# Patient Record
Sex: Female | Born: 1995 | Race: White | Hispanic: No | Smoking: Former smoker
Health system: Southern US, Community
[De-identification: ages and names within clinical notes are randomized; demographics above are authoritative.]

## PROBLEM LIST (undated history)

## (undated) DIAGNOSIS — T7840XA Allergy, unspecified, initial encounter: Secondary | ICD-10-CM

## (undated) DIAGNOSIS — M1990 Unspecified osteoarthritis, unspecified site: Secondary | ICD-10-CM

## (undated) DIAGNOSIS — J45909 Unspecified asthma, uncomplicated: Secondary | ICD-10-CM

## (undated) DIAGNOSIS — M419 Scoliosis, unspecified: Secondary | ICD-10-CM

## (undated) DIAGNOSIS — F509 Eating disorder, unspecified: Secondary | ICD-10-CM

## (undated) DIAGNOSIS — R51 Headache: Secondary | ICD-10-CM

## (undated) DIAGNOSIS — F419 Anxiety disorder, unspecified: Secondary | ICD-10-CM

## (undated) DIAGNOSIS — F329 Major depressive disorder, single episode, unspecified: Secondary | ICD-10-CM

## (undated) DIAGNOSIS — F32A Depression, unspecified: Secondary | ICD-10-CM

## (undated) HISTORY — DX: Major depressive disorder, single episode, unspecified: F32.9

## (undated) HISTORY — DX: Depression, unspecified: F32.A

## (undated) HISTORY — PX: DENTAL SURGERY: SHX609

## (undated) HISTORY — DX: Scoliosis, unspecified: M41.9

---
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
## 2017-08-11 ENCOUNTER — Other Ambulatory Visit: Payer: Self-pay

## 2017-08-11 ENCOUNTER — Encounter: Payer: Self-pay | Admitting: Family Medicine

## 2017-08-11 ENCOUNTER — Ambulatory Visit (INDEPENDENT_AMBULATORY_CARE_PROVIDER_SITE_OTHER): Payer: BLUE CROSS/BLUE SHIELD | Admitting: Family Medicine

## 2017-08-11 VITALS — BP 125/86 | HR 82 | Temp 97.8°F | Resp 18 | Ht 66.77 in | Wt 130.8 lb

## 2017-08-11 DIAGNOSIS — M545 Low back pain, unspecified: Secondary | ICD-10-CM | POA: Insufficient documentation

## 2017-08-11 DIAGNOSIS — Z5181 Encounter for therapeutic drug level monitoring: Secondary | ICD-10-CM

## 2017-08-11 DIAGNOSIS — G8929 Other chronic pain: Secondary | ICD-10-CM | POA: Diagnosis not present

## 2017-08-11 DIAGNOSIS — F640 Transsexualism: Secondary | ICD-10-CM | POA: Diagnosis not present

## 2017-08-11 DIAGNOSIS — Z789 Other specified health status: Secondary | ICD-10-CM

## 2017-08-11 MED ORDER — TESTOSTERONE CYPIONATE 200 MG/ML IM SOLN: INTRAMUSCULAR | 0 refills | Status: DC

## 2017-08-11 NOTE — Patient Instructions (Signed)
     IF you received an x-ray today, you will receive an invoice from [facility]. Please contact [facility] at [phone] with questions or concerns regarding your invoice.   IF you received labwork today, you will receive an invoice from [facility]. Please contact [facility] at [phone] with questions or concerns regarding your invoice.   Our billing staff will not be able to assist you with questions regarding bills from these companies.  You will be contacted with the lab results as soon as they are available. The fastest way to get your results is to activate your My Chart account. Instructions are located on the last page of this paperwork. If you have not heard from us regarding the results in 2 weeks, please contact this office.     

## 2017-08-11 NOTE — Progress Notes (Signed)
Subjective:  By signing my name below, I, Susan Tran, attest that this documentation has been prepared under the direction and in the presence of Susan SorensonEva Shaw, MD. Electronically Signed: Stann Oresung-Kai Tran, Scribe. 08/11/2017 , 5:16 PM .  Patient was seen in Room 3 .   Patient ID: Susan Tran, female    DOB: [birth year], 22 y.o.   MRN: [id number] Chief Complaint  Patient presents with  . Establish Care    Pt states his PCP is retiring and needs a new PCP  . Transitioning    Pt states he is currently taking testerone for a year.    HPI Susan Tran is a 22 y.o. female who [facility]. He is currently taking testosterone for transitioning. Patient was previously followed by Susan Tran, who is retiring on March 31st. He's had his blood hormone levels checked recently at his last appoint and was informed that his levels were looking good. He notes that the current dose is working well for him. He's been on this dose of testosterone for over a year now. He's at "maintenance level" now. He started his testosterone in Nov 21st, 2017. He stopped having periods after 3rd-4th month of injections.   Patient denies history of of chronic medical problems.   He also denies having albuterol inhaler due to cost prohibitive. He mentions asthma was worst in middle school so he stopped playing sports and doesn't have any problem with it since but also does do much physical activity. He reports the most activity he has is lifting at work, causing him back pain. He was prescribed medication for this, but he tries not to take it.   He has a history of depression when he was 5115-22 years old. He denies taking medication for it in the past few years. He took Prozac once while in the hospital in 2014, but never taken chronic medication for anxiety/depression.   He notes drug allergies of penicillin causing him to vomit, and cephalosporins causing him to vomit and/or rash.   He  quit smoking a while back, and hasn't smoked this year.   He denies surgical history.   He came into the office today with his fiance, Susan Tran.   Past Medical History:  Diagnosis Date  . Allergy   . Anxiety   . Asthma   . Depression   . Eating disorder    can go "days" with eating very little.   Marland Kitchen. Headache(784.0)    History reviewed. No pertinent surgical history. Prior to Admission medications   Medication Sig Start Date End Date Taking? Authorizing Provider  ibuprofen (ADVIL,MOTRIN) 200 MG tablet Take 1.5-2 tablets (300-400 mg total) by mouth every 6 (six) hours as needed for pain. Patient may resume home supply. 01/26/13  Yes Winson, Susan BunKim B, NP  testosterone cypionate (DEPOTESTOSTERONE CYPIONATE) 200 MG/ML injection INJ 0.5 ML IM Q 2 WKS UTD 08/06/17  Yes [provider]  albuterol (PROVENTIL HFA;VENTOLIN HFA) 108 (90 BASE) MCG/ACT inhaler Inhale 2 puffs into the lungs every 6 (six) hours as needed for wheezing or shortness of breath. Pateint may resume home supply. 01/26/13   Winson, Susan BunKim B, NP  FLUoxetine (PROZAC) 20 MG capsule Take 1 capsule (20 mg total) by mouth daily. Patient not taking: Reported on 01/08/2016 01/26/13   Susan SchimkeWinson, Susan B, NP   Allergies  Allergen Reactions  . Cephalosporins Other (See Comments)    "very sick,flu like symptoms"  . Penicillins Other (See Comments)    "  very sick, flu like symptoms"   History reviewed. No pertinent family history. Social History   Socioeconomic History  . Marital status: Single    Spouse name: None  . Number of children: None  . Years of education: None  . Highest education level: None  Social Needs  . Financial resource strain: None  . Food insecurity - worry: None  . Food insecurity - inability: None  . Transportation needs - medical: None  . Transportation needs - non-medical: None  Occupational History  . None  Tobacco Use  . Smoking status: Never Smoker  . Smokeless tobacco: Never Used  Substance and Sexual  Activity  . Alcohol use: No  . Drug use: No  . Sexual activity: No  Other Topics Concern  . None  Social History Narrative  . None   Depression screen [facility] 2/9 08/11/2017  Decreased Interest 0  Down, Depressed, Hopeless 0  PHQ - 2 Score 0    Review of Systems  Constitutional: Negative for chills, fatigue, fever and unexpected weight change.  Respiratory: Negative for cough.   Gastrointestinal: Negative for constipation, diarrhea, nausea and vomiting.  Skin: Negative for rash and wound.  Neurological: Negative for dizziness, weakness and headaches.       Objective:   Physical Exam  Constitutional: She is oriented to person, place, and time. She appears well-developed and well-nourished. No distress.  HENT:  Head: Normocephalic and atraumatic.  Eyes: EOM are normal. Pupils are equal, round, and reactive to light.  Neck: Neck supple. No thyromegaly present.  Cardiovascular: Normal rate, regular rhythm and normal heart sounds.  No murmur heard. Pulmonary/Chest: Effort normal and breath sounds normal. No respiratory distress. She has no wheezes.  Musculoskeletal: Normal range of motion.  Neurological: She is alert and oriented to person, place, and time.  Skin: Skin is warm and dry.  Psychiatric: She has a normal mood and affect. Her behavior is normal.  Nursing note and vitals reviewed.   Pulse 82   Temp 97.8 F (36.6 C) (Oral)   Resp 18   Ht 5' 6.77" (1.696 m)   Wt 130 lb 12.8 oz (59.3 kg)   SpO2 98%   BMI 20.63 kg/m      Assessment & Plan:   1. 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 Jordan Tran - saw Dr. Jeral Tran and referred to PT at [facility] - went once IMPRESSION: Abnormal appearance of the L5 pars. On the left, pars appears elongated/thin but grossly intact. On the right, cannot confirm intact pars.  Dysplastic lamina/spinous process. 5 mm anterior slip L5 with mild uncovering of the disc. Mild L5-S1 foraminal narrowing greater on the left with mild contact with the exiting L5 nerve root. No significant spinal stenosis.  If it would change the patient's course of therapy, CT imaging to evaluate the L5 pars region and posterior ring can be performed. In order to minimize radiation, imaging from mid L4 through mid S1 with sagittal and coronal reconstruction may be obtained (would not obtained a full lumbar spine CT).   2. Female-to-female transgender person - refilled testosterone x 3 mos while waiting on Dr. Mariella Saa records - on average dose and stable for > 1 yr  3. Medication monitoring encounter   Future lab orders placed - will have pt come have labs drawn in ~6 mos if  I don't get copies of records/labs from Dr. Mariella Saa office in the interim as reports had labs done there recently and were normal.   Orders Placed This Encounter  Procedures  . Lipid panel    Standing Status:   Future    Standing Expiration Date:   09/25/2018    Order Specific Question:   Has the patient fasted?    Answer:   Yes  . CBC with Differential/Platelet    Standing Status:   Future    Standing Expiration Date:   09/25/2018  . Comprehensive metabolic panel    Standing Status:   Future    Standing Expiration Date:   09/25/2018    Order Specific Question:   Has the patient fasted?    Answer:   Yes  . TSH    Standing Status:   Future    Standing Expiration Date:   09/25/2018  . TestT+TestF+SHBG    Standing Status:   Future    Standing Expiration Date:   09/25/2018  . Estradiol    Standing Status:   Future    Standing Expiration Date:   09/25/2018    Meds ordered this encounter  Medications  . testosterone cypionate (DEPOTESTOSTERONE CYPIONATE) 200 MG/ML injection    Sig: INJ 0.5 ML [place] Q 2 WKS    Dispense:  6 mL    Refill:  0    I personally performed the services described in this documentation,  which was scribed in my presence. The recorded information has been reviewed and considered, and addended by me as needed.   Susan Sorenson, M.D.  Primary Care at [facility] [address] [place], [place] [address] [phone] phone [phone] fax  09/24/17 12:22 PM

## 2017-09-24 ENCOUNTER — Encounter: Payer: Self-pay | Admitting: Family Medicine

## 2017-09-24 ENCOUNTER — Other Ambulatory Visit: Payer: Self-pay | Admitting: Family Medicine

## 2017-09-24 NOTE — Telephone Encounter (Signed)
Yes, will refill x additional 3 mos but I put in future orders and would like pt to come in for lab only visit sometime in the next 3 mos before further refills

## 2017-09-24 NOTE — Telephone Encounter (Signed)
Testosterone Last filled:08/11/17 Last OV:08/11/17 NWG:NFAO Pharmacy: [id number] - [place], [place] - [address] AT [address] [phone] (Phone) [phone] (Fax)

## 2017-09-24 NOTE — Telephone Encounter (Signed)
Please send letter re request for labs.  Please use female pronouns - or [name].  I would like you to come by the office for a "lab-only" visit before you need additional medication refills in another 3 months.  You may walk-in to the [address] appointment clinic any time (M-F 8-6, Sat 8-4) to have your blood drawn without an appointment as I have already ordered the "future" labs in your chart. Make sure to let the front desk know that you are there for a lab-only visit and that you do not need to see a provider.  You do need to be fasting.

## 2017-09-24 NOTE — Telephone Encounter (Signed)
Attempted to call pt. Mail box full.  °

## 2017-09-30 ENCOUNTER — Telehealth: Payer: Self-pay

## 2017-09-30 NOTE — Telephone Encounter (Signed)
PA for testosterone started

## 2017-12-04 ENCOUNTER — Other Ambulatory Visit: Payer: Self-pay | Admitting: Family Medicine

## 2018-01-29 ENCOUNTER — Telehealth: Payer: Self-pay | Admitting: Family Medicine

## 2018-01-29 NOTE — Telephone Encounter (Signed)
Called and spoke with pt. Informed pt of Shaw being on leave and rescheduled appt . Advised of time, building and late policy. °

## 2018-02-05 ENCOUNTER — Telehealth: Payer: Self-pay

## 2018-02-05 NOTE — Telephone Encounter (Signed)
Called pt. To reschedule visit with Dr. Clelia CroftShaw on 03/02/18. If pt. Calls back please reschedule with Dr. Leretha PolSantiago and only Dr. Leretha PolSantiago

## 2018-02-09 ENCOUNTER — Ambulatory Visit: Payer: BLUE CROSS/BLUE SHIELD | Admitting: Family Medicine

## 2018-03-02 ENCOUNTER — Ambulatory Visit: Payer: BLUE CROSS/BLUE SHIELD | Admitting: Family Medicine

## 2018-03-02 ENCOUNTER — Telehealth: Payer: Self-pay | Admitting: Family Medicine

## 2018-03-02 MED ORDER — TESTOSTERONE CYPIONATE 200 MG/ML IM SOLN
INTRAMUSCULAR | 0 refills | Status: DC
Start: 2018-03-02 — End: 2018-03-03

## 2018-03-02 NOTE — Telephone Encounter (Signed)
I recommend that pt come in and have his future labs drawn at a lab only visit asap - can do that at any time -- then rec he resched the OV to be with me for whenever next available.  As soon as I get the lab results, I will be happy to send in enough refills to get him to whenever his appt w/ me is sched for.  And then we will have the results avail to review together at his visit!   It is important he still be on the testosterone when he gets his labs drawn so I went ahead and sent in one a 1 mo supply to [facility] -  hopefully this will give him sufficient time to get in for the lab only visit sometime within the next month.

## 2018-03-02 NOTE — Telephone Encounter (Signed)
I had to reschedule an appt for Susan Tran. He was scheduled with Susan Tran on 03/02/2018 but was cancelled because Susan Tran was out. Rescheduled with Susan Tran for 03/30/2018. The patient is worried about his testosterone. He doesn't want to run out before he is able to be seen.

## 2018-03-03 ENCOUNTER — Telehealth: Payer: Self-pay | Admitting: *Deleted

## 2018-03-03 MED ORDER — TESTOSTERONE CYPIONATE 200 MG/ML IM SOLN: INTRAMUSCULAR | 0 refills | Status: DC

## 2018-03-03 NOTE — Addendum Note (Signed)
Addended by: Sherren Mocha on: 03/03/2018 12:27 PM   Modules accepted: Orders

## 2018-03-03 NOTE — Telephone Encounter (Signed)
Patient returning call. Advised him of Dr Alver Fisher message below. Patient rescheduled appointment to 05/11/18 at 3:00pm

## 2018-03-03 NOTE — Telephone Encounter (Signed)
Dr. Clelia Croft, Patient states there is no way he can get in for lab draws until his appointment on the 03-30-2018.  States it is out of his way and he works.  Advised of hours

## 2018-03-03 NOTE — Telephone Encounter (Signed)
Okay.  I would still rather him reschedule with me and then refill his testosterone to get him to that appointment, even if it is a bit later, as his labs and refill request will still be coming to me even if he sees [place] -do not want to end up in a right hand doing one thing and left another situation. I will cancel the one month refill I sent in yesterday and instead send in a 63-month.  That should give him adequate time to find an appointment time with me that works.

## 2018-03-03 NOTE — Telephone Encounter (Signed)
No voicemail set up  Please advise patient from Dr. Clelia Croft below note.

## 2018-03-09 NOTE — Telephone Encounter (Signed)
Unable to reach.

## 2018-03-30 ENCOUNTER — Ambulatory Visit: Payer: BLUE CROSS/BLUE SHIELD | Admitting: Family Medicine

## 2018-05-11 ENCOUNTER — Ambulatory Visit (INDEPENDENT_AMBULATORY_CARE_PROVIDER_SITE_OTHER): Payer: BLUE CROSS/BLUE SHIELD | Admitting: Family Medicine

## 2018-05-11 ENCOUNTER — Encounter: Payer: Self-pay | Admitting: Family Medicine

## 2018-05-11 ENCOUNTER — Other Ambulatory Visit: Payer: Self-pay

## 2018-05-11 DIAGNOSIS — Z5181 Encounter for therapeutic drug level monitoring: Secondary | ICD-10-CM | POA: Diagnosis not present

## 2018-05-11 DIAGNOSIS — F640 Transsexualism: Secondary | ICD-10-CM

## 2018-05-11 DIAGNOSIS — Z789 Other specified health status: Secondary | ICD-10-CM

## 2018-05-11 MED ORDER — "NEEDLE (DISP) 23G X 3/4"" MISC": 1.0000 [IU] | 0 refills | Status: DC | PRN

## 2018-05-11 MED ORDER — TESTOSTERONE CYPIONATE 200 MG/ML IM SOLN: INTRAMUSCULAR | 1 refills | Status: DC

## 2018-05-11 NOTE — Progress Notes (Addendum)
Subjective:    Patient: Susan Tran  DOB: [birth year]; 22 y.o.   MRN: [id number]  Chief Complaint  Patient presents with  . Medication Management    HPI  Started T 04/16/16.  Injection was last done 12/2  Has been on the 0.5 q2 yrs and feels like he is doing really well on this dose without complaints. Susan Tran is here today with his fiance Susan Tran (cis-female).  Medical History Past Medical History:  Diagnosis Date  . Acquired scoliosis 10/30/2015   11/27/2015 L-spine xray mild spondylolisthesis of L5 on S1. 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 12/11/15 Lumbar MRI ordered by [facility] shows abnml L5 pars with Lt elongated/thin and Rt can't confirm intact w/ dysplastic lamina/spinous process; 5mm ant slip L5 w/ mild uncovering of disc. Mild L5-S1 foraminal narrowing Lt>Rt w/ mild L5 nerve root contact; referred to Dr. Jeral FruitBotero who rec PT  . Allergy   . Anxiety   . Asthma 2012   no problems/flairs since middle school. does NOT need to keep inhaler on hand  . Depression 2014-2015   hospitalized in 2014 after self-harm, SI - started on prozac but then never any outpatient medication treatment following or recurrent exacerbations  . Eating disorder    can go "days" with eating very little.   Marland Kitchen. Headache(784.0)    No past surgical history on file. Current Outpatient Medications on File Prior to Visit  Medication Sig Dispense Refill  . albuterol (PROVENTIL HFA;VENTOLIN HFA) 108 (90 BASE) MCG/ACT inhaler Inhale 2 puffs into the lungs every 6 (six) hours as needed for wheezing or shortness of breath. Pateint may resume home supply.    Marland Kitchen. ibuprofen (ADVIL,MOTRIN) 200 MG tablet Take 1.5-2 tablets (300-400 mg total) by mouth every 6 (six) hours as needed for pain. Patient may resume home supply.    . naproxen (EC NAPROSYN) 500 MG EC tablet Take by mouth.    . testosterone cypionate (DEPOTESTOSTERONE CYPIONATE) 200 MG/ML injection INJECT 0.5ML IN THE MUSCLE EVERY 14 DAYS. 3 mL 0   No current  facility-administered medications on file prior to visit.    Allergies  Allergen Reactions  . Cephalosporins Other (See Comments)    "very sick,flu like symptoms"  . Penicillins Other (See Comments)    "very sick, flu like symptoms"   No family history on file. Social History   Socioeconomic History  . Marital status: Significant Other    Spouse name: Susan Tran  . Number of children: 0  . Years of education: Not on file  . Highest education level: Not on file  Occupational History  . Not on file  Social Needs  . Financial resource strain: Not on file  . Food insecurity:    Worry: Not on file    Inability: Not on file  . Transportation needs:    Medical: Not on file    Non-medical: Not on file  Tobacco Use  . Smoking status: Former Smoker    Last attempt to quit: 2018    Years since quitting: 1.9  . Smokeless tobacco: Never Used  Substance and Sexual Activity  . Alcohol use: No  . Drug use: No  . Sexual activity: Yes    Birth control/protection: Other-see comments    Comment: NA - pt is biologically female and in monogamous relationship with a female  Lifestyle  . Physical activity:    Days per week: Not on file    Minutes per session: Not on file  . Stress: Not on file  Relationships  . Social connections:    Talks on phone: Not on file    Gets together: Not on file    Attends religious service: Not on file    Active member of club or organization: Not on file    Attends meetings of clubs or organizations: Not on file    Relationship status: Not on file  Other Topics Concern  . Not on file  Social History Narrative  . Not on file   Depression screen [facility] 2/9 05/11/2018 08/11/2017  Decreased Interest 0 0  Down, Depressed, Hopeless 0 0  PHQ - 2 Score 0 0    ROS As noted in HPI  Objective:  BP 102/67   Pulse 72   Temp 98 F (36.7 C) (Oral)   Resp 16   Wt 135 lb (61.2 kg)   SpO2 100%   BMI 21.29 kg/m  Physical Exam Constitutional:      General: He  is not in acute distress.    Appearance: He is well-developed. He is not diaphoretic.  HENT:     Head: Normocephalic and atraumatic.     Right Ear: External ear normal.     Left Ear: External ear normal.  Eyes:     General: No scleral icterus.    Conjunctiva/sclera: Conjunctivae normal.  Neck:     Musculoskeletal: Normal range of motion and neck supple.     Thyroid: No thyromegaly.  Cardiovascular:     Rate and Rhythm: Normal rate and regular rhythm.     Heart sounds: Normal heart sounds.  Pulmonary:     Effort: Pulmonary effort is normal. No respiratory distress.     Breath sounds: Normal breath sounds.  Lymphadenopathy:     Cervical: No cervical adenopathy.  Skin:    General: Skin is warm and dry.     Findings: No erythema.  Neurological:     Mental Status: He is alert and oriented to person, place, and time.  Psychiatric:        Behavior: Behavior normal.     POC TESTING Office Visit on 05/11/2018  Component Date Value Ref Range Status  . Cholesterol, Total 05/11/2018 116  100 - 199 mg/dL Final  . Triglycerides 05/11/2018 104  0 - 149 mg/dL Final  . HDL 16/02/9603 35* >39 mg/dL Final  . VLDL Cholesterol Cal 05/11/2018 21  5 - 40 mg/dL Final  . LDL Calculated 05/11/2018 60  0 - 99 mg/dL Final  . Chol/HDL Ratio 05/11/2018 3.3  0.0 - 4.4 ratio Final   Comment:                                   T. Chol/HDL Ratio                                             Men  Women                               1/2 Avg.Risk  3.4    3.3                                   Avg.Risk  5.0    4.4  2X Avg.Risk  9.6    7.1                                3X Avg.Risk 23.4   11.0   . Estradiol 05/11/2018 177.1  pg/mL Final   Comment:                     Adult Female:                       Follicular phase   12.5 -   166.0                       Ovulation phase    85.8 -   498.0                       Luteal phase       43.8 -   211.0                        Postmenopausal     <6.0 -    54.7                     Pregnancy                       1st trimester     215.0 - >4300.0                     Girls (1-10 years)    6.0 -    27.0 Roche ECLIA methodology   . Testosterone, Total, LC/MS 05/11/2018 234.8* 10.0 - 55.0 ng/dL Final  . Testosterone, Free 05/11/2018 4.0  0.0 - 4.2 pg/mL Final  . Sex Hormone Binding 05/11/2018 36.3  24.6 - 122.0 nmol/L Final  . TSH 05/11/2018 1.230  0.450 - 4.500 uIU/mL Final  . Glucose 05/11/2018 61* 65 - 99 mg/dL Final  . BUN 16/02/9603 10  6 - 20 mg/dL Final  . Creatinine, Ser 05/11/2018 0.84  0.57 - 1.00 mg/dL Final  . GFR calc non Af Amer 05/11/2018 100  >59 mL/min/1.73 Final  . GFR calc Af Amer 05/11/2018 116  >59 mL/min/1.73 Final  . BUN/Creatinine Ratio 05/11/2018 12  9 - 23 Final  . Sodium 05/11/2018 140  134 - 144 mmol/L Final  . Potassium 05/11/2018 4.2  3.5 - 5.2 mmol/L Final  . Chloride 05/11/2018 102  96 - 106 mmol/L Final  . CO2 05/11/2018 23  20 - 29 mmol/L Final  . Calcium 05/11/2018 9.5  8.7 - 10.2 mg/dL Final  . Total Protein 05/11/2018 7.4  6.0 - 8.5 g/dL Final  . Albumin 54/01/8118 4.8  3.5 - 5.5 g/dL Final  . Globulin, Total 05/11/2018 2.6  1.5 - 4.5 g/dL Final  . Albumin/Globulin Ratio 05/11/2018 1.8  1.2 - 2.2 Final  . Bilirubin Total 05/11/2018 0.7  0.0 - 1.2 mg/dL Final  . Alkaline Phosphatase 05/11/2018 126* 39 - 117 IU/L Final  . AST 05/11/2018 17  0 - 40 IU/L Final  . ALT 05/11/2018 13  0 - 32 IU/L Final  . WBC 05/11/2018 7.1  3.4 - 10.8 x10E3/uL Final  . RBC 05/11/2018 5.04  3.77 - 5.28 x10E6/uL Final  . Hemoglobin 05/11/2018 16.1* 11.1 - 15.9 g/dL Final  . Hematocrit 14/78/2956 46.3  34.0 - 46.6 % Final  . MCV 05/11/2018 92  79 - 97 fL Final  . MCH 05/11/2018 31.9  26.6 - 33.0 pg Final  . MCHC 05/11/2018 34.8  31.5 - 35.7 g/dL Final  . RDW 16/02/9603 12.3  12.3 - 15.4 % Final   Comment: **Effective June 01, 2018, the RDW pediatric reference**   interval will be removed and  the adult reference interval   will be changing to:                             Female 11.7 - 15.4                                                      Female 11.6 - 15.4   . Platelets 05/11/2018 217  150 - 450 x10E3/uL Final  . Neutrophils 05/11/2018 70  Not Estab. % Final  . Lymphs 05/11/2018 21  Not Estab. % Final  . Monocytes 05/11/2018 9  Not Estab. % Final  . Eos 05/11/2018 0  Not Estab. % Final  . Basos 05/11/2018 0  Not Estab. % Final  . Neutrophils Absolute 05/11/2018 4.9  1.4 - 7.0 x10E3/uL Final  . Lymphocytes Absolute 05/11/2018 1.5  0.7 - 3.1 x10E3/uL Final  . Monocytes Absolute 05/11/2018 0.7  0.1 - 0.9 x10E3/uL Final  . EOS (ABSOLUTE) 05/11/2018 0.0  0.0 - 0.4 x10E3/uL Final  . Basophils Absolute 05/11/2018 0.0  0.0 - 0.2 x10E3/uL Final  . Immature Granulocytes 05/11/2018 0  Not Estab. % Final  . Immature Grans (Abs) 05/11/2018 0.0  0.0 - 0.1 x10E3/uL Final     Assessment & Plan:   1. Female-to-female transgender person   2. Medication monitoring encounter   Injection of testosterone 0.5 mL (equals 100 mg) taken every 2 weeks is due today as last was given exactly 14 days prior on 12/2 so levels today will be at nadir/trough.  Has been stable on this dose for > 2 years.  Patient will continue on current chronic medications other than changes noted above, so ok to refill when needed.   See after visit summary for patient specific instructions.   Meds ordered this encounter  Medications  . NEEDLE, DISP, 23 G 23G X 3/4" MISC    Sig: 1 Units by Does not apply route as needed.    Dispense:  100 each    Refill:  0  . testosterone cypionate (DEPOTESTOSTERONE CYPIONATE) 200 MG/ML injection    Sig: INJECT 0.5ML IN THE MUSCLE EVERY 14 DAYS. USE A NEW VIAL FOR EACH INJECTION.    Dispense:  6 mL    Refill:  1    Patient verbalized to me that they understand the following: diagnosis, what is being done for them, what to expect and what should be done at home.  Their questions  have been answered. They understand that I am unable to predict every possible medication interaction or adverse outcome and that if any unexpected symptoms arise, they should contact us and their pharmacist, as well as never hesitate to seek urgent/emergent care at [facility] or ER if they think it might be warranted.    Norberto Sorenson, MD, MPH Primary Care at [facility] [address] [place], [place]  [address] [phone] Office phone  [phone] Office fax  05/11/18 3:19 PM

## 2018-05-11 NOTE — Patient Instructions (Signed)
° ° ° °  If you have lab work done today you will be contacted with your lab results within the next 2 weeks.  If you have not heard from us then please contact us. The fastest way to get your results is to register for My Chart. ° ° °IF you received an x-ray today, you will receive an invoice from [facility]. Please contact [facility] at [phone] with questions or concerns regarding your invoice.  ° °IF you received labwork today, you will receive an invoice from [facility]. Please contact [facility] at [phone] with questions or concerns regarding your invoice.  ° °Our billing staff will not be able to assist you with questions regarding bills from these companies. ° °You will be contacted with the lab results as soon as they are available. The fastest way to get your results is to activate your My Chart account. Instructions are located on the last page of this paperwork. If you have not heard from us regarding the results in 2 weeks, please contact this office. °  ° ° ° °

## 2018-05-16 LAB — CBC WITH DIFFERENTIAL/PLATELET
Basophils Absolute: 0 10*3/uL (ref 0.0–0.2)
Basos: 0 %
EOS (ABSOLUTE): 0 10*3/uL (ref 0.0–0.4)
Eos: 0 %
HEMATOCRIT: 46.3 % (ref 34.0–46.6)
HEMOGLOBIN: 16.1 g/dL — AB (ref 11.1–15.9)
Immature Grans (Abs): 0 10*3/uL (ref 0.0–0.1)
Immature Granulocytes: 0 %
Lymphocytes Absolute: 1.5 10*3/uL (ref 0.7–3.1)
Lymphs: 21 %
MCH: 31.9 pg (ref 26.6–33.0)
MCHC: 34.8 g/dL (ref 31.5–35.7)
MCV: 92 fL (ref 79–97)
MONOCYTES: 9 %
Monocytes Absolute: 0.7 10*3/uL (ref 0.1–0.9)
NEUTROS ABS: 4.9 10*3/uL (ref 1.4–7.0)
Neutrophils: 70 %
Platelets: 217 10*3/uL (ref 150–450)
RBC: 5.04 x10E6/uL (ref 3.77–5.28)
RDW: 12.3 % (ref 12.3–15.4)
WBC: 7.1 10*3/uL (ref 3.4–10.8)

## 2018-05-16 LAB — LIPID PANEL
CHOLESTEROL TOTAL: 116 mg/dL (ref 100–199)
Chol/HDL Ratio: 3.3 ratio (ref 0.0–4.4)
HDL: 35 mg/dL — ABNORMAL LOW (ref >39)
LDL Calculated: 60 mg/dL (ref 0–99)
Triglycerides: 104 mg/dL (ref 0–149)
VLDL Cholesterol Cal: 21 mg/dL (ref 5–40)

## 2018-05-16 LAB — COMPREHENSIVE METABOLIC PANEL
ALT: 13 IU/L (ref 0–32)
AST: 17 IU/L (ref 0–40)
Albumin/Globulin Ratio: 1.8 (ref 1.2–2.2)
Albumin: 4.8 g/dL (ref 3.5–5.5)
Alkaline Phosphatase: 126 IU/L — ABNORMAL HIGH (ref 39–117)
BUN/Creatinine Ratio: 12 (ref 9–23)
BUN: 10 mg/dL (ref 6–20)
Bilirubin Total: 0.7 mg/dL (ref 0.0–1.2)
CALCIUM: 9.5 mg/dL (ref 8.7–10.2)
CO2: 23 mmol/L (ref 20–29)
Chloride: 102 mmol/L (ref 96–106)
Creatinine, Ser: 0.84 mg/dL (ref 0.57–1.00)
GFR, EST AFRICAN AMERICAN: 116 mL/min/{1.73_m2} (ref >59)
GFR, EST NON AFRICAN AMERICAN: 100 mL/min/{1.73_m2} (ref >59)
Globulin, Total: 2.6 g/dL (ref 1.5–4.5)
Glucose: 61 mg/dL — ABNORMAL LOW (ref 65–99)
Potassium: 4.2 mmol/L (ref 3.5–5.2)
Sodium: 140 mmol/L (ref 134–144)
TOTAL PROTEIN: 7.4 g/dL (ref 6.0–8.5)

## 2018-05-16 LAB — TESTT+TESTF+SHBG
Sex Hormone Binding: 36.3 nmol/L (ref 24.6–122.0)
Testosterone, Free: 4 pg/mL (ref 0.0–4.2)
Testosterone, Total, LC/MS: 234.8 ng/dL — ABNORMAL HIGH (ref 10.0–55.0)

## 2018-05-16 LAB — TSH: TSH: 1.23 u[IU]/mL (ref 0.450–4.500)

## 2018-05-16 LAB — ESTRADIOL: Estradiol: 177.1 pg/mL

## 2018-06-02 ENCOUNTER — Other Ambulatory Visit: Payer: Self-pay | Admitting: Family Medicine

## 2018-06-02 DIAGNOSIS — Z7952 Long term (current) use of systemic steroids: Secondary | ICD-10-CM

## 2018-06-02 DIAGNOSIS — Z5181 Encounter for therapeutic drug level monitoring: Secondary | ICD-10-CM

## 2018-06-02 DIAGNOSIS — Z79890 Hormone replacement therapy (postmenopausal): Secondary | ICD-10-CM

## 2018-06-02 NOTE — Telephone Encounter (Signed)
Requested medication (s) are due for refill today -yes  Requested medication (s) are on the active medication list -yes  Future visit scheduled -yes  Last refill: 05/11/18  Notes to clinic: Patient is requesting a medication that is not assigned to protocol. Sent for provider review  Requested Prescriptions  Pending Prescriptions Disp Refills   testosterone cypionate (DEPOTESTOSTERONE CYPIONATE) 200 MG/ML injection [Pharmacy Med Name: TESTOSTERONE CYP 200MG /ML INJ, 1ML] 3 mL     Sig: ADMINISTER 0.5 ML IN THE MUSCLE EVERY 14 DAYS     Off-Protocol Failed - 06/02/2018 12:56 PM      Failed - Medication not assigned to a protocol, review manually.      Passed - Valid encounter within last 12 months    Recent Outpatient Visits          3 weeks ago Female-to-female transgender person   [facility], Levell July, MD   9 months ago Chronic midline low back pain without sciatica   [facility], Levell July, MD      Future Appointments            In 5 months Clelia Croft Levell July, MD Primary Care at [place], [facility]            Requested Prescriptions  Pending Prescriptions Disp Refills   testosterone cypionate (DEPOTESTOSTERONE CYPIONATE) 200 MG/ML injection [Pharmacy Med Name: TESTOSTERONE CYP 200MG /ML INJ, 1ML] 3 mL     Sig: ADMINISTER 0.5 ML IN THE MUSCLE EVERY 14 DAYS     Off-Protocol Failed - 06/02/2018 12:56 PM      Failed - Medication not assigned to a protocol, review manually.      Passed - Valid encounter within last 12 months    Recent Outpatient Visits          3 weeks ago Female-to-female transgender person   [facility], Levell July, MD   9 months ago Chronic midline low back pain without sciatica   [facility], Levell July, MD      Future Appointments            In 5 months Clelia Croft Levell July, MD Primary Care at [place], [facility]

## 2018-06-08 NOTE — Telephone Encounter (Signed)
1/13 talked w/ pt about last labs - drawn at trough/nadir - on 0.18ml (=100mg ) every 2 weeks of testosterone and was due for injection that day but levels still have estradiol level 4x ULN and T levels were actually below normal range so will increase dose TODAY 1/13 Mon to 0.76mL (=120mg ) every 2 weeks. Try x 1 mo - if feeling good - more energy, mood better, than ok to increase to 0.42mL (=140mg ) every 2 weeks (Call and let me know before making change so can document timing and reasons/reactions to increase) - then recheck labs in 2-3 months after last dose increase before further refills. Ok to do in lab only visit - rec 1 wk after injection at midway point. Future labs entered.

## 2018-07-13 ENCOUNTER — Encounter: Payer: Self-pay | Admitting: Family Medicine

## 2018-07-24 ENCOUNTER — Telehealth: Payer: Self-pay | Admitting: Family Medicine

## 2018-07-24 NOTE — Telephone Encounter (Signed)
Pt called to cancel upcoming appt on 11/09/2018 pt was aware the Dr.Shaw was opening her own practice [facility]

## 2018-11-09 ENCOUNTER — Ambulatory Visit: Payer: BLUE CROSS/BLUE SHIELD | Admitting: Family Medicine

## 2019-03-26 ENCOUNTER — Other Ambulatory Visit: Payer: Self-pay | Admitting: *Deleted

## 2019-03-26 DIAGNOSIS — Z20822 Contact with and (suspected) exposure to covid-19: Secondary | ICD-10-CM

## 2019-03-28 LAB — NOVEL CORONAVIRUS, NAA: SARS-CoV-2, NAA: NOT DETECTED

## 2019-05-25 ENCOUNTER — Other Ambulatory Visit: Payer: Self-pay | Admitting: Student

## 2019-05-25 DIAGNOSIS — M4317 Spondylolisthesis, lumbosacral region: Secondary | ICD-10-CM

## 2019-05-26 ENCOUNTER — Other Ambulatory Visit: Payer: Self-pay | Admitting: Student

## 2019-05-26 DIAGNOSIS — M4317 Spondylolisthesis, lumbosacral region: Secondary | ICD-10-CM

## 2019-06-03 ENCOUNTER — Ambulatory Visit: Payer: Managed Care, Other (non HMO)

## 2019-06-05 ENCOUNTER — Other Ambulatory Visit: Payer: Managed Care, Other (non HMO)

## 2019-06-13 ENCOUNTER — Ambulatory Visit
Admission: RE | Admit: 2019-06-13 | Discharge: 2019-06-13 | Disposition: A | Discharge status: Home / Self Care | Payer: Managed Care, Other (non HMO) | Source: Ambulatory Visit | Attending: Student | Admitting: Student

## 2019-06-13 ENCOUNTER — Other Ambulatory Visit: Payer: Self-pay

## 2019-06-13 DIAGNOSIS — M4317 Spondylolisthesis, lumbosacral region: Secondary | ICD-10-CM

## 2019-10-11 ENCOUNTER — Other Ambulatory Visit: Payer: Self-pay | Admitting: Neurosurgery

## 2019-10-27 ENCOUNTER — Other Ambulatory Visit: Payer: Self-pay

## 2019-10-27 ENCOUNTER — Encounter
Admission: RE | Admit: 2019-10-27 | Discharge: 2019-10-27 | Disposition: A | Discharge status: Home / Self Care | Payer: Managed Care, Other (non HMO) | Source: Ambulatory Visit | Attending: Neurosurgery | Admitting: Neurosurgery

## 2019-10-27 NOTE — Patient Instructions (Addendum)
COVID TESTING Date:  Monday, June 7 Testing site:  [facility] [address] Thru Hours:  5:36 am - 1:00 pm Once you are tested, you are asked to stay quarantined (avoiding public places) until after your surgery.   Your procedure is scheduled on: Wednesday, June 9 Report to Day Surgery on the 2nd floor of the [facility]. To find out your arrival time, please call [phone] between 1PM - 3PM on: Tuesday, June 8  REMEMBER: Instructions that are not followed completely may result in serious medical risk, up to and including death; or upon the discretion of your surgeon and anesthesiologist your surgery may need to be rescheduled.  Do not eat food after midnight the night before surgery.  No gum chewing, lozengers or hard candies.  You may however, drink CLEAR liquids up to 2 hours before you are scheduled to arrive for your surgery. Do not drink anything within 2 hours of your scheduled arrival time.  Clear liquids include: - water   - apple juice without pulp - gatorade (not RED) - black coffee or tea (Do NOT add milk or creamers to the coffee or tea) Do NOT drink anything that is not on this list.  DO NOT TAKE ANY MEDICATIONS THE MORNING OF SURGERY   Stop Anti-inflammatories (NSAIDS) such as Advil, Aleve, Ibuprofen, Motrin, Naproxen, Naprosyn and Aspirin based products such as Excedrin, Goodys Powder, BC Powder. (May take Tylenol or Acetaminophen if needed.)  Stop ANY OVER THE COUNTER supplements until after surgery.  No Alcohol for 24 hours before or after surgery.  No Smoking including e-cigarettes for 24 hours prior to surgery.  No chewable tobacco products for at least 6 hours prior to surgery.  No nicotine patches on the day of surgery.  Do not use any "recreational" drugs for at least a week prior to your surgery.  Please be advised that the combination of cocaine and anesthesia may have negative outcomes, up to and including  death. If you test positive for cocaine, your surgery will be cancelled.  On the morning of surgery brush your teeth with toothpaste and water, you may rinse your mouth with mouthwash if you wish. Do not swallow any toothpaste or mouthwash.  Do not wear jewelry, make-up, hairpins, clips or nail polish.  Do not wear lotions, powders, or perfumes.   Do not shave 48 hours prior to surgery.   Contact lenses, hearing aids and dentures may not be worn into surgery.  Do not bring valuables to the hospital. [facility] is not responsible for any missing/lost belongings or valuables.   Use CHG Soap as directed on instruction sheet.  Notify your doctor if there is any change in your medical condition (cold, fever, infection).  Wear comfortable clothing (specific to your surgery type) to the hospital.  Plan for stool softeners for home use; pain medications have a tendency to cause constipation. You can also help prevent constipation by eating foods high in fiber such as fruits and vegetables and drinking plenty of fluids as your diet allows.  After surgery, you can help prevent lung complications by doing breathing exercises.  Take deep breaths and cough every 1-2 hours. Your doctor may order a device called an Incentive Spirometer to help you take deep breaths.  If you are being admitted to the hospital overnight, leave your suitcase in the car. After surgery it may be brought to your room.  If you are taking public transportation, you will need to  have a responsible adult (18 years or older) with you. Please confirm with your physician that it is acceptable to use public transportation.   Please call the Pre-admissions Testing Dept. at [phone] if you have any questions about these instructions.  Visitation Policy:  Patients undergoing a surgery or procedure may have one family member or support person with them as long as that person is not COVID-19 positive or experiencing its  symptoms.  That person may remain in the waiting area during the procedure.  Inpatient Visitation Update:   Two designated support people may visit a patient during visiting hours 7 am to 8 pm. It must be the same two designated people for the duration of the patient stay. The visitors may come and go during the day, and there is no switching out to have different visitors. A mask must be worn at all times, including in the patient room.  As a reminder, masks are still required for all [facility] [place] team members, patients and visitors in all [facility] facilities.   Systemwide, no visitors 17 or younger.

## 2019-10-28 ENCOUNTER — Encounter
Admission: RE | Admit: 2019-10-28 | Discharge: 2019-10-28 | Disposition: A | Discharge status: Home / Self Care | Payer: BC Managed Care – PPO | Source: Ambulatory Visit | Attending: Neurosurgery | Admitting: Neurosurgery

## 2019-10-28 DIAGNOSIS — Z01812 Encounter for preprocedural laboratory examination: Secondary | ICD-10-CM | POA: Insufficient documentation

## 2019-10-28 LAB — URINALYSIS, ROUTINE W REFLEX MICROSCOPIC
Bacteria, UA: NONE SEEN
Bilirubin Urine: NEGATIVE
Glucose, UA: NEGATIVE mg/dL
Hgb urine dipstick: NEGATIVE
Ketones, ur: NEGATIVE mg/dL
Nitrite: NEGATIVE
Protein, ur: NEGATIVE mg/dL
Specific Gravity, Urine: 1.021 (ref 1.005–1.030)
pH: 6 (ref 5.0–8.0)

## 2019-10-28 LAB — BASIC METABOLIC PANEL
Anion gap: 9 (ref 5–15)
BUN: 9 mg/dL (ref 6–20)
CO2: 27 mmol/L (ref 22–32)
Calcium: 9.3 mg/dL (ref 8.9–10.3)
Chloride: 104 mmol/L (ref 98–111)
Creatinine, Ser: 0.88 mg/dL (ref 0.44–1.00)
GFR calc Af Amer: >60 mL/min (ref >60)
GFR calc non Af Amer: >60 mL/min (ref >60)
Glucose, Bld: 88 mg/dL (ref 70–99)
Potassium: 3.5 mmol/L (ref 3.5–5.1)
Sodium: 140 mmol/L (ref 135–145)

## 2019-10-28 LAB — CBC
HCT: 49.1 % — ABNORMAL HIGH (ref 36.0–46.0)
Hemoglobin: 16.4 g/dL — ABNORMAL HIGH (ref 12.0–15.0)
MCH: 31.4 pg (ref 26.0–34.0)
MCHC: 33.4 g/dL (ref 30.0–36.0)
MCV: 94.1 fL (ref 80.0–100.0)
Platelets: 196 10*3/uL (ref 150–400)
RBC: 5.22 MIL/uL — ABNORMAL HIGH (ref 3.87–5.11)
RDW: 12.7 % (ref 11.5–15.5)
WBC: 5.7 10*3/uL (ref 4.0–10.5)
nRBC: 0 % (ref 0.0–0.2)

## 2019-10-28 LAB — SURGICAL PCR SCREEN
MRSA, PCR: NEGATIVE
Staphylococcus aureus: NEGATIVE

## 2019-10-28 LAB — TYPE AND SCREEN
ABO/RH(D): O NEG
Antibody Screen: NEGATIVE

## 2019-10-28 LAB — PROTIME-INR
INR: 1 (ref 0.8–1.2)
Prothrombin Time: 13 seconds (ref 11.4–15.2)

## 2019-10-28 LAB — APTT: aPTT: 31 seconds (ref 24–36)

## 2019-11-01 ENCOUNTER — Other Ambulatory Visit: Payer: Self-pay

## 2019-11-01 ENCOUNTER — Other Ambulatory Visit
Admission: RE | Admit: 2019-11-01 | Discharge: 2019-11-01 | Disposition: A | Discharge status: Home / Self Care | Payer: BC Managed Care – PPO | Source: Ambulatory Visit | Attending: Neurosurgery | Admitting: Neurosurgery

## 2019-11-01 DIAGNOSIS — Z01812 Encounter for preprocedural laboratory examination: Secondary | ICD-10-CM | POA: Insufficient documentation

## 2019-11-01 DIAGNOSIS — Z20822 Contact with and (suspected) exposure to covid-19: Secondary | ICD-10-CM | POA: Insufficient documentation

## 2019-11-01 LAB — SARS CORONAVIRUS 2 (TAT 6-24 HRS): SARS Coronavirus 2: NEGATIVE

## 2019-11-03 ENCOUNTER — Inpatient Hospital Stay: Payer: BC Managed Care – PPO | Admitting: Registered Nurse

## 2019-11-03 ENCOUNTER — Inpatient Hospital Stay: Payer: BC Managed Care – PPO

## 2019-11-03 ENCOUNTER — Other Ambulatory Visit: Payer: Self-pay

## 2019-11-03 ENCOUNTER — Encounter
Admission: RE | Disposition: A | Discharge status: Home / Self Care | Payer: Self-pay | Source: Home / Self Care | Attending: Neurosurgery

## 2019-11-03 ENCOUNTER — Encounter: Payer: Self-pay | Admitting: Neurosurgery

## 2019-11-03 ENCOUNTER — Inpatient Hospital Stay
Admission: RE | Admit: 2019-11-03 | Discharge: 2019-11-05 | DRG: 455 | Disposition: A | Discharge status: Home / Self Care | Payer: BC Managed Care – PPO | Attending: Neurosurgery | Admitting: Neurosurgery

## 2019-11-03 DIAGNOSIS — Z20822 Contact with and (suspected) exposure to covid-19: Secondary | ICD-10-CM | POA: Diagnosis present

## 2019-11-03 DIAGNOSIS — Z8249 Family history of ischemic heart disease and other diseases of the circulatory system: Secondary | ICD-10-CM | POA: Diagnosis not present

## 2019-11-03 DIAGNOSIS — Z888 Allergy status to other drugs, medicaments and biological substances status: Secondary | ICD-10-CM | POA: Diagnosis not present

## 2019-11-03 DIAGNOSIS — M48061 Spinal stenosis, lumbar region without neurogenic claudication: Secondary | ICD-10-CM | POA: Diagnosis present

## 2019-11-03 DIAGNOSIS — Z915 Personal history of self-harm: Secondary | ICD-10-CM

## 2019-11-03 DIAGNOSIS — M5416 Radiculopathy, lumbar region: Secondary | ICD-10-CM

## 2019-11-03 DIAGNOSIS — Z79890 Hormone replacement therapy (postmenopausal): Secondary | ICD-10-CM | POA: Diagnosis not present

## 2019-11-03 DIAGNOSIS — G43909 Migraine, unspecified, not intractable, without status migrainosus: Secondary | ICD-10-CM | POA: Diagnosis present

## 2019-11-03 DIAGNOSIS — F17200 Nicotine dependence, unspecified, uncomplicated: Secondary | ICD-10-CM | POA: Diagnosis present

## 2019-11-03 DIAGNOSIS — M4317 Spondylolisthesis, lumbosacral region: Secondary | ICD-10-CM | POA: Diagnosis present

## 2019-11-03 DIAGNOSIS — M25552 Pain in left hip: Secondary | ICD-10-CM | POA: Diagnosis present

## 2019-11-03 DIAGNOSIS — M25569 Pain in unspecified knee: Secondary | ICD-10-CM | POA: Diagnosis present

## 2019-11-03 DIAGNOSIS — Z818 Family history of other mental and behavioral disorders: Secondary | ICD-10-CM | POA: Diagnosis not present

## 2019-11-03 DIAGNOSIS — Z419 Encounter for procedure for purposes other than remedying health state, unspecified: Secondary | ICD-10-CM

## 2019-11-03 DIAGNOSIS — Z880 Allergy status to penicillin: Secondary | ICD-10-CM

## 2019-11-03 DIAGNOSIS — J45909 Unspecified asthma, uncomplicated: Secondary | ICD-10-CM | POA: Diagnosis present

## 2019-11-03 DIAGNOSIS — M25551 Pain in right hip: Secondary | ICD-10-CM | POA: Diagnosis present

## 2019-11-03 DIAGNOSIS — Z833 Family history of diabetes mellitus: Secondary | ICD-10-CM

## 2019-11-03 DIAGNOSIS — M79651 Pain in right thigh: Secondary | ICD-10-CM | POA: Diagnosis present

## 2019-11-03 DIAGNOSIS — Z9889 Other specified postprocedural states: Secondary | ICD-10-CM

## 2019-11-03 DIAGNOSIS — F329 Major depressive disorder, single episode, unspecified: Secondary | ICD-10-CM | POA: Diagnosis present

## 2019-11-03 HISTORY — PX: MAXIMUM ACCESS (MAS) TRANSFORAMINAL LUMBAR INTERBODY FUSION (TLIF) 1 LEVEL: SHX6392

## 2019-11-03 LAB — URINE DRUG SCREEN, QUALITATIVE (ARMC ONLY)
Amphetamines, Ur Screen: NOT DETECTED
Barbiturates, Ur Screen: NOT DETECTED
Benzodiazepine, Ur Scrn: NOT DETECTED
Cannabinoid 50 Ng, Ur ~~LOC~~: POSITIVE — AB
Cocaine Metabolite,Ur ~~LOC~~: NOT DETECTED
MDMA (Ecstasy)Ur Screen: NOT DETECTED
Methadone Scn, Ur: NOT DETECTED
Opiate, Ur Screen: NOT DETECTED
Phencyclidine (PCP) Ur S: NOT DETECTED
Tricyclic, Ur Screen: NOT DETECTED

## 2019-11-03 LAB — ABO/RH: ABO/RH(D): O NEG

## 2019-11-03 LAB — POCT PREGNANCY, URINE: Preg Test, Ur: NEGATIVE

## 2019-11-03 SURGERY — MAXIMUM ACCESS (MAS) TRANSFORAMINAL LUMBAR INTERBODY FUSION (TLIF) 1 LEVEL
Anesthesia: General

## 2019-11-03 MED ORDER — MENTHOL 3 MG MT LOZG
1.0000 | LOZENGE | OROMUCOSAL | Status: DC | PRN
  Filled 2019-11-03: qty 9

## 2019-11-03 MED ORDER — GLYCOPYRROLATE 0.2 MG/ML IJ SOLN
INTRAMUSCULAR | Status: DC | PRN
  Administered 2019-11-03: .2 mg via INTRAVENOUS

## 2019-11-03 MED ORDER — METHOCARBAMOL 1000 MG/10ML IJ SOLN
500.0000 mg | Freq: Four times a day (QID) | INTRAVENOUS | Status: DC | PRN
  Administered 2019-11-03: 500 mg via INTRAVENOUS
  Filled 2019-11-03: qty 5

## 2019-11-03 MED ORDER — PHENYLEPHRINE HCL (PRESSORS) 10 MG/ML IV SOLN
INTRAVENOUS | Status: DC | PRN
  Administered 2019-11-03: 150 ug via INTRAVENOUS
  Administered 2019-11-03: 200 ug via INTRAVENOUS
  Administered 2019-11-03: 100 ug via INTRAVENOUS
  Administered 2019-11-03 (×2): 200 ug via INTRAVENOUS
  Administered 2019-11-03 (×2): 100 ug via INTRAVENOUS
  Administered 2019-11-03: 150 ug via INTRAVENOUS
  Administered 2019-11-03 (×2): 100 ug via INTRAVENOUS

## 2019-11-03 MED ORDER — ACETAMINOPHEN 325 MG PO TABS: 650.0000 mg | ORAL_TABLET | ORAL | Status: DC | PRN

## 2019-11-03 MED ORDER — SODIUM CHLORIDE 0.9% FLUSH
3.0000 mL | Freq: Two times a day (BID) | INTRAVENOUS | Status: DC
  Administered 2019-11-04: 3 mL via INTRAVENOUS

## 2019-11-03 MED ORDER — DEXAMETHASONE SODIUM PHOSPHATE 10 MG/ML IJ SOLN
INTRAMUSCULAR | Status: DC | PRN
  Administered 2019-11-03: 10 mg via INTRAVENOUS

## 2019-11-03 MED ORDER — PHENOL 1.4 % MT LIQD
1.0000 | OROMUCOSAL | Status: DC | PRN
  Filled 2019-11-03: qty 177

## 2019-11-03 MED ORDER — EPHEDRINE 5 MG/ML INJ
INTRAVENOUS | Status: AC
  Filled 2019-11-03: qty 10

## 2019-11-03 MED ORDER — PROPOFOL 10 MG/ML IV BOLUS
INTRAVENOUS | Status: AC
  Filled 2019-11-03: qty 20

## 2019-11-03 MED ORDER — PHENYLEPHRINE HCL (PRESSORS) 10 MG/ML IV SOLN
INTRAVENOUS | Status: AC
  Filled 2019-11-03: qty 1

## 2019-11-03 MED ORDER — ROCURONIUM BROMIDE 100 MG/10ML IV SOLN
INTRAVENOUS | Status: DC | PRN
  Administered 2019-11-03: 5 mg via INTRAVENOUS

## 2019-11-03 MED ORDER — PROPOFOL 10 MG/ML IV BOLUS
INTRAVENOUS | Status: DC | PRN
  Administered 2019-11-03: 160 mg via INTRAVENOUS

## 2019-11-03 MED ORDER — SODIUM CHLORIDE 0.9 % IV SOLN
INTRAVENOUS | Status: DC | PRN
  Administered 2019-11-03: 40 ug/min via INTRAVENOUS

## 2019-11-03 MED ORDER — BUPIVACAINE-EPINEPHRINE (PF) 0.5% -1:200000 IJ SOLN
INTRAMUSCULAR | Status: AC
  Filled 2019-11-03: qty 90

## 2019-11-03 MED ORDER — BACITRACIN 50000 UNITS IM SOLR
INTRAMUSCULAR | Status: AC
  Filled 2019-11-03: qty 1

## 2019-11-03 MED ORDER — FENTANYL CITRATE (PF) 100 MCG/2ML IJ SOLN
INTRAMUSCULAR | Status: DC | PRN
  Administered 2019-11-03 (×3): 50 ug via INTRAVENOUS

## 2019-11-03 MED ORDER — SODIUM CHLORIDE 0.9 % IV SOLN
250.0000 mL | INTRAVENOUS | Status: DC
  Administered 2019-11-04: 250 mL via INTRAVENOUS

## 2019-11-03 MED ORDER — HYDROMORPHONE HCL 1 MG/ML IJ SOLN
0.5000 mg | INTRAMUSCULAR | Status: DC | PRN
  Administered 2019-11-03: 0.5 mg via INTRAVENOUS
  Filled 2019-11-03: qty 1

## 2019-11-03 MED ORDER — SODIUM CHLORIDE (PF) 0.9 % IJ SOLN
INTRAMUSCULAR | Status: AC
  Filled 2019-11-03: qty 50

## 2019-11-03 MED ORDER — ONDANSETRON HCL 4 MG/2ML IJ SOLN
4.0000 mg | Freq: Four times a day (QID) | INTRAMUSCULAR | Status: DC | PRN
  Administered 2019-11-03: 4 mg via INTRAVENOUS
  Filled 2019-11-03: qty 2

## 2019-11-03 MED ORDER — LIDOCAINE HCL (PF) 2 % IJ SOLN
INTRAMUSCULAR | Status: AC
  Filled 2019-11-03: qty 5

## 2019-11-03 MED ORDER — OXYCODONE HCL 5 MG PO TABS: 5.0000 mg | ORAL_TABLET | ORAL | Status: DC | PRN

## 2019-11-03 MED ORDER — ACETAMINOPHEN 650 MG RE SUPP: 650.0000 mg | RECTAL | Status: DC | PRN

## 2019-11-03 MED ORDER — VANCOMYCIN HCL IN DEXTROSE 1-5 GM/200ML-% IV SOLN: 1000.0000 mg | Freq: Once | INTRAVENOUS | Status: AC

## 2019-11-03 MED ORDER — MIDAZOLAM HCL 2 MG/2ML IJ SOLN
INTRAMUSCULAR | Status: DC | PRN
  Administered 2019-11-03: 2 mg via INTRAVENOUS

## 2019-11-03 MED ORDER — METHOCARBAMOL 500 MG PO TABS
500.0000 mg | ORAL_TABLET | Freq: Four times a day (QID) | ORAL | Status: DC | PRN
  Administered 2019-11-04: 500 mg via ORAL
  Filled 2019-11-03: qty 1

## 2019-11-03 MED ORDER — SODIUM CHLORIDE (PF) 0.9 % IJ SOLN
INTRAMUSCULAR | Status: AC
  Filled 2019-11-03: qty 10

## 2019-11-03 MED ORDER — FENTANYL CITRATE (PF) 100 MCG/2ML IJ SOLN
25.0000 ug | INTRAMUSCULAR | Status: DC | PRN
  Administered 2019-11-03 (×4): 25 ug via INTRAVENOUS

## 2019-11-03 MED ORDER — SUCCINYLCHOLINE CHLORIDE 200 MG/10ML IV SOSY
PREFILLED_SYRINGE | INTRAVENOUS | Status: AC
  Filled 2019-11-03: qty 10

## 2019-11-03 MED ORDER — SODIUM CHLORIDE 0.9 % IV SOLN
INTRAVENOUS | Status: DC | PRN
  Administered 2019-11-03: 40 mL

## 2019-11-03 MED ORDER — FENTANYL CITRATE (PF) 100 MCG/2ML IJ SOLN
INTRAMUSCULAR | Status: AC
  Filled 2019-11-03: qty 2

## 2019-11-03 MED ORDER — FAMOTIDINE 20 MG PO TABS
ORAL_TABLET | ORAL | Status: AC
  Filled 2019-11-03: qty 1

## 2019-11-03 MED ORDER — ACETAMINOPHEN 10 MG/ML IV SOLN
INTRAVENOUS | Status: AC
  Filled 2019-11-03: qty 100

## 2019-11-03 MED ORDER — CHLORHEXIDINE GLUCONATE 0.12 % MT SOLN
OROMUCOSAL | Status: AC
  Filled 2019-11-03: qty 15

## 2019-11-03 MED ORDER — VANCOMYCIN HCL IN DEXTROSE 1-5 GM/200ML-% IV SOLN
INTRAVENOUS | Status: AC
  Administered 2019-11-03: 1000 mg via INTRAVENOUS
  Filled 2019-11-03: qty 200

## 2019-11-03 MED ORDER — FENTANYL CITRATE (PF) 250 MCG/5ML IJ SOLN
INTRAMUSCULAR | Status: AC
  Filled 2019-11-03: qty 5

## 2019-11-03 MED ORDER — EPHEDRINE SULFATE 50 MG/ML IJ SOLN
INTRAMUSCULAR | Status: DC | PRN
  Administered 2019-11-03 (×2): 10 mg via INTRAVENOUS
  Administered 2019-11-03: 5 mg via INTRAVENOUS

## 2019-11-03 MED ORDER — ONDANSETRON HCL 4 MG PO TABS: 4.0000 mg | ORAL_TABLET | Freq: Four times a day (QID) | ORAL | Status: DC | PRN

## 2019-11-03 MED ORDER — GLYCOPYRROLATE 0.2 MG/ML IJ SOLN
INTRAMUSCULAR | Status: AC
  Filled 2019-11-03: qty 1

## 2019-11-03 MED ORDER — LIDOCAINE HCL (CARDIAC) PF 100 MG/5ML IV SOSY
PREFILLED_SYRINGE | INTRAVENOUS | Status: DC | PRN
  Administered 2019-11-03: 100 mg via INTRAVENOUS

## 2019-11-03 MED ORDER — POLYETHYLENE GLYCOL 3350 17 G PO PACK: 17.0000 g | PACK | Freq: Every day | ORAL | Status: DC | PRN

## 2019-11-03 MED ORDER — SODIUM CHLORIDE 0.9 % IV SOLN: 250.0000 mL | INTRAVENOUS | Status: DC

## 2019-11-03 MED ORDER — MIDAZOLAM HCL 2 MG/2ML IJ SOLN
INTRAMUSCULAR | Status: AC
  Filled 2019-11-03: qty 2

## 2019-11-03 MED ORDER — ONDANSETRON HCL 4 MG/2ML IJ SOLN
INTRAMUSCULAR | Status: AC
  Filled 2019-11-03: qty 2

## 2019-11-03 MED ORDER — DEXAMETHASONE SODIUM PHOSPHATE 10 MG/ML IJ SOLN
INTRAMUSCULAR | Status: AC
  Filled 2019-11-03: qty 1

## 2019-11-03 MED ORDER — BUPIVACAINE HCL (PF) 0.5 % IJ SOLN
INTRAMUSCULAR | Status: AC
  Filled 2019-11-03: qty 30

## 2019-11-03 MED ORDER — SODIUM CHLORIDE FLUSH 0.9 % IV SOLN
INTRAVENOUS | Status: AC
  Filled 2019-11-03: qty 10

## 2019-11-03 MED ORDER — OXYCODONE HCL 5 MG PO TABS
10.0000 mg | ORAL_TABLET | ORAL | Status: DC | PRN
  Administered 2019-11-03 – 2019-11-04 (×3): 10 mg via ORAL
  Filled 2019-11-03 (×3): qty 2

## 2019-11-03 MED ORDER — CHLORHEXIDINE GLUCONATE 0.12 % MT SOLN
15.0000 mL | Freq: Once | OROMUCOSAL | Status: AC
  Administered 2019-11-03: 15 mL via OROMUCOSAL

## 2019-11-03 MED ORDER — BUPIVACAINE LIPOSOME 1.3 % IJ SUSP
INTRAMUSCULAR | Status: AC
  Filled 2019-11-03: qty 20

## 2019-11-03 MED ORDER — ONDANSETRON HCL 4 MG/2ML IJ SOLN
INTRAMUSCULAR | Status: DC | PRN
  Administered 2019-11-03: 4 mg via INTRAVENOUS

## 2019-11-03 MED ORDER — THROMBIN 5000 UNITS EX SOLR
CUTANEOUS | Status: DC | PRN
  Administered 2019-11-03: 5000 [IU] via TOPICAL

## 2019-11-03 MED ORDER — SUCCINYLCHOLINE CHLORIDE 20 MG/ML IJ SOLN
INTRAMUSCULAR | Status: DC | PRN
  Administered 2019-11-03: 80 mg via INTRAVENOUS

## 2019-11-03 MED ORDER — ROCURONIUM BROMIDE 10 MG/ML (PF) SYRINGE
PREFILLED_SYRINGE | INTRAVENOUS | Status: AC
  Filled 2019-11-03: qty 10

## 2019-11-03 MED ORDER — KETAMINE HCL 10 MG/ML IJ SOLN
INTRAMUSCULAR | Status: DC | PRN
  Administered 2019-11-03: 20 mg via INTRAVENOUS
  Administered 2019-11-03 (×3): 10 mg via INTRAVENOUS

## 2019-11-03 MED ORDER — BUPIVACAINE HCL (PF) 0.5 % IJ SOLN
INTRAMUSCULAR | Status: DC | PRN
  Administered 2019-11-03: 20 mL

## 2019-11-03 MED ORDER — BISACODYL 10 MG RE SUPP: 10.0000 mg | Freq: Every day | RECTAL | Status: DC | PRN

## 2019-11-03 MED ORDER — DEXMEDETOMIDINE HCL 200 MCG/2ML IV SOLN
INTRAVENOUS | Status: DC | PRN
  Administered 2019-11-03: 12 ug via INTRAVENOUS
  Administered 2019-11-03: 8 ug via INTRAVENOUS

## 2019-11-03 MED ORDER — FAMOTIDINE 20 MG PO TABS
20.0000 mg | ORAL_TABLET | Freq: Once | ORAL | Status: AC
  Administered 2019-11-03: 20 mg via ORAL

## 2019-11-03 MED ORDER — LACTATED RINGERS IV SOLN: INTRAVENOUS | Status: DC

## 2019-11-03 MED ORDER — SODIUM CHLORIDE 0.9 % IR SOLN
Status: DC | PRN
  Administered 2019-11-03: 1000 mL

## 2019-11-03 MED ORDER — SODIUM CHLORIDE FLUSH 0.9 % IV SOLN
INTRAVENOUS | Status: AC
  Administered 2019-11-04: 3 mL via INTRAVENOUS
  Filled 2019-11-03: qty 10

## 2019-11-03 MED ORDER — ONDANSETRON HCL 4 MG/2ML IJ SOLN: 4.0000 mg | Freq: Once | INTRAMUSCULAR | Status: DC | PRN

## 2019-11-03 MED ORDER — ACETAMINOPHEN 10 MG/ML IV SOLN
INTRAVENOUS | Status: DC | PRN
  Administered 2019-11-03: 1000 mg via INTRAVENOUS

## 2019-11-03 MED ORDER — BUPIVACAINE-EPINEPHRINE (PF) 0.5% -1:200000 IJ SOLN
INTRAMUSCULAR | Status: DC | PRN
  Administered 2019-11-03: 5 mL

## 2019-11-03 MED ORDER — ORAL CARE MOUTH RINSE: 15.0000 mL | Freq: Once | OROMUCOSAL | Status: AC

## 2019-11-03 MED ORDER — SENNA 8.6 MG PO TABS
1.0000 | ORAL_TABLET | Freq: Two times a day (BID) | ORAL | Status: DC
  Administered 2019-11-03 – 2019-11-05 (×5): 8.6 mg via ORAL
  Filled 2019-11-03 (×4): qty 1

## 2019-11-03 MED ORDER — FLEET ENEMA 7-19 GM/118ML RE ENEM: 1.0000 | ENEMA | Freq: Once | RECTAL | Status: DC | PRN

## 2019-11-03 MED ORDER — SODIUM CHLORIDE 0.9% FLUSH
3.0000 mL | INTRAVENOUS | Status: DC | PRN
  Administered 2019-11-04: 3 mL via INTRAVENOUS

## 2019-11-03 SURGICAL SUPPLY — 87 items
BMAC2-60-01 PROCEDURE PACK ×2 IMPLANT
BULB RESERV EVAC DRAIN JP 100C (MISCELLANEOUS) IMPLANT
BUR NEURO DRILL SOFT 3.0X3.8M (BURR) ×2 IMPLANT
CANISTER SUCT 1200ML W/VALVE (MISCELLANEOUS) ×4 IMPLANT
CAP LOCKING SPINE (Cap) ×8 IMPLANT
CHLORAPREP W/TINT 26 (MISCELLANEOUS) ×2 IMPLANT
CNTNR SPEC 2.5X3XGRAD LEK (MISCELLANEOUS) ×1
CONT SPEC 4OZ STER OR WHT (MISCELLANEOUS) ×1
CONTAINER SPEC 2.5X3XGRAD LEK (MISCELLANEOUS) ×1 IMPLANT
COUNTER NEEDLE 20/40 LG (NEEDLE) ×2 IMPLANT
COVER LIGHT HANDLE STERIS (MISCELLANEOUS) ×4 IMPLANT
COVER WAND RF STERILE (DRAPES) ×2 IMPLANT
CUP MEDICINE 2OZ PLAST GRAD ST (MISCELLANEOUS) ×4 IMPLANT
DERMABOND ADVANCED (GAUZE/BANDAGES/DRESSINGS) ×2
DERMABOND ADVANCED .7 DNX12 (GAUZE/BANDAGES/DRESSINGS) ×2 IMPLANT
DRAIN CHANNEL JP 10F RND 20C F (MISCELLANEOUS) IMPLANT
DRAPE C-ARM 42X72 X-RAY (DRAPES) ×4 IMPLANT
DRAPE C-ARMOR (DRAPES) ×2 IMPLANT
DRAPE INCISE IOBAN 66X45 STRL (DRAPES) ×2 IMPLANT
DRAPE LAPAROTOMY 100X77 ABD (DRAPES) ×2 IMPLANT
DRAPE MICROSCOPE SPINE 48X150 (DRAPES) ×2 IMPLANT
DRAPE POUCH INSTRU U-SHP 10X18 (DRAPES) IMPLANT
DRAPE SURG 17X11 SM STRL (DRAPES) ×8 IMPLANT
DRSG OPSITE POSTOP 3X4 (GAUZE/BANDAGES/DRESSINGS) ×4 IMPLANT
DRSG OPSITE POSTOP 4X8 (GAUZE/BANDAGES/DRESSINGS) IMPLANT
DRSG TEGADERM 4X4.75 (GAUZE/BANDAGES/DRESSINGS) IMPLANT
ELECT CAUTERY BLADE TIP 2.5 (TIP) ×2
ELECT EZSTD 165MM 6.5IN (MISCELLANEOUS)
ELECT REM PT RETURN 9FT ADLT (ELECTROSURGICAL) ×2
ELECTRODE CAUTERY BLDE TIP 2.5 (TIP) ×1 IMPLANT
ELECTRODE EZSTD 165MM 6.5IN (MISCELLANEOUS) IMPLANT
ELECTRODE REM PT RTRN 9FT ADLT (ELECTROSURGICAL) ×1 IMPLANT
FEE INTRAOP MONITOR IMPULS NCS (MISCELLANEOUS) ×1 IMPLANT
FRAME EYE SHIELD (PROTECTIVE WEAR) ×4 IMPLANT
GAUZE XEROFORM 4X4 STRL (GAUZE/BANDAGES/DRESSINGS) IMPLANT
GLOVE BIO SURGEON STRL SZ 6.5 (GLOVE) ×4 IMPLANT
GLOVE BIOGEL PI IND STRL 7.0 (GLOVE) ×1 IMPLANT
GLOVE BIOGEL PI INDICATOR 7.0 (GLOVE) ×1
GLOVE SURG SYN 7.0 (GLOVE) ×2 IMPLANT
GLOVE SURG SYN 8.5  E (GLOVE) ×3
GLOVE SURG SYN 8.5 E (GLOVE) ×3 IMPLANT
GOWN SRG XL LVL 3 NONREINFORCE (GOWNS) ×1 IMPLANT
GOWN STRL NON-REIN TWL XL LVL3 (GOWNS) ×1
GOWN STRL REUS W/ TWL XL LVL3 (GOWN DISPOSABLE) ×1 IMPLANT
GOWN STRL REUS W/TWL MED LVL3 (GOWN DISPOSABLE) IMPLANT
GOWN STRL REUS W/TWL XL LVL3 (GOWN DISPOSABLE) ×1
GRADUATE 1200CC STRL 31836 (MISCELLANEOUS) ×2 IMPLANT
HEMOVAC 400CC 10FR (MISCELLANEOUS) IMPLANT
HOLDER FOLEY CATH W/STRAP (MISCELLANEOUS) ×2 IMPLANT
INTERBODY SABLE 10X26 7-14 15D (Miscellaneous) ×2 IMPLANT
INTRAOP MONITOR FEE IMPULS NCS (MISCELLANEOUS) ×1
INTRAOP MONITOR FEE IMPULSE (MISCELLANEOUS) ×1
KIT ASP BONE MRW 60CC (KITS) ×2 IMPLANT
KIT PEDICLE ACCESS (KITS) ×2 IMPLANT
KIT SPINAL PRONEVIEW (KITS) ×2 IMPLANT
KNIFE BAYONET SHORT DISCETOMY (MISCELLANEOUS) IMPLANT
MARKER SKIN DUAL TIP RULER LAB (MISCELLANEOUS) ×4 IMPLANT
MILL MEDIUM DISP (BLADE) IMPLANT
NDL SAFETY ECLIPSE 18X1.5 (NEEDLE) ×1 IMPLANT
NEEDLE HYPO 18GX1.5 SHARP (NEEDLE) ×1
NEEDLE HYPO 22GX1.5 SAFETY (NEEDLE) ×2 IMPLANT
NS IRRIG 1000ML POUR BTL (IV SOLUTION) ×2 IMPLANT
PACK LAMINECTOMY NEURO (CUSTOM PROCEDURE TRAY) ×2 IMPLANT
PAD ARMBOARD 7.5X6 YLW CONV (MISCELLANEOUS) ×2 IMPLANT
PUTTY DBX 5CC (Putty) ×2 IMPLANT
ROD SPINAL 5.5X45 TI CRVD (Rod) ×4 IMPLANT
SCREW CREO 6.5X40 FEN (Screw) ×6 IMPLANT
SCREW CREO 6.5X45 FEN (Screw) ×2 IMPLANT
SCREW CREO MIS 30 TULIP (Screw) ×8 IMPLANT
SPOGE SURGIFLO 8M (HEMOSTASIS) ×1
SPONGE DRAIN TRACH 4X4 STRL 2S (GAUZE/BANDAGES/DRESSINGS) IMPLANT
SPONGE SURGIFLO 8M (HEMOSTASIS) ×1 IMPLANT
STAPLER SKIN PROX 35W (STAPLE) IMPLANT
SUT DVC VLOC 3-0 CL 6 P-12 (SUTURE) ×2 IMPLANT
SUT VIC AB 0 CT1 27 (SUTURE) ×2
SUT VIC AB 0 CT1 27XCR 8 STRN (SUTURE) ×2 IMPLANT
SUT VIC AB 2-0 CT1 18 (SUTURE) ×4 IMPLANT
SYR 20ML LL LF (SYRINGE) ×2 IMPLANT
SYR 30ML LL (SYRINGE) ×4 IMPLANT
SYR 3ML LL SCALE MARK (SYRINGE) ×2 IMPLANT
TOWEL OR 17X26 4PK STRL BLUE (TOWEL DISPOSABLE) ×6 IMPLANT
TRAY FOLEY SLVR 16FR LF STAT (SET/KITS/TRAYS/PACK) ×2 IMPLANT
TUBE MATRX SPINL 26MM 4CM DISP (INSTRUMENTS) ×2 IMPLANT
TUBING CONNECTING 10 (TUBING) ×2 IMPLANT
WIRE K 1.45 NITINOL 500 (WIRE) ×4 IMPLANT
WIRE-K 1.45 NITINOL 500 (WIRE) ×8
nitinol k-wires, sharp ×8 IMPLANT

## 2019-11-03 NOTE — Transfer of Care (Signed)
Immediate Anesthesia Transfer of Care Note  Patient: Lynnda Wiersma  Procedure(s) Performed: MINIMALLY INVASIVE (MIS) TRANSFORAMINAL LUMBAR INTERBODY FUSION (TLIF) 1 LEVEL L5-S1 (N/A ) BONE MARROW ASPIRATION FOR SPINE FUSION ONLY (BMAC) (N/A )  Patient Location: PACU  Anesthesia Type:General  Level of Consciousness: drowsy  Airway & Oxygen Therapy: Patient Spontanous Breathing and Patient connected to face mask oxygen  Post-op Assessment: Report given to RN and Post -op Vital signs reviewed and stable  Post vital signs: Reviewed and stable  Last Vitals:  Vitals Value Taken Time  BP 114/66 11/03/19 1809  Temp    Pulse 79 11/03/19 1815  Resp 5 11/03/19 1815  SpO2 100 % 11/03/19 1815  Vitals shown include unvalidated device data.  Last Pain:  Vitals:   11/03/19 1807  TempSrc:   PainSc: (P) Asleep      Patients Stated Pain Goal: 3 (11/03/19 1240)  Complications: No apparent anesthesia complications

## 2019-11-03 NOTE — Anesthesia Preprocedure Evaluation (Addendum)
Anesthesia Evaluation  Patient identified by MRN, date of birth, ID band Patient awake    Reviewed: Allergy & Precautions, NPO status , Patient's Chart, lab work & pertinent test results  History of Anesthesia Complications Negative for: history of anesthetic complications  Airway Mallampati: II       Dental   Pulmonary asthma (as a child, no inhalers > 10 yrs) , former smoker,           Cardiovascular (-) hypertension(-) Past MI and (-) CHF (-) dysrhythmias (-) Valvular Problems/Murmurs     Neuro/Psych neg Seizures Anxiety Depression    GI/Hepatic neg GERD  ,(+)     substance abuse  marijuana use,   Endo/Other  neg diabetes  Renal/GU negative Renal ROS     Musculoskeletal   Abdominal   Peds  Hematology   Anesthesia Other Findings   Reproductive/Obstetrics                            Anesthesia Physical Anesthesia Plan  ASA: II  Anesthesia Plan: General   Post-op Pain Management:    Induction: Intravenous  PONV Risk Score and Plan: 2 and Dexamethasone and Ondansetron  Airway Management Planned: Oral ETT  Additional Equipment:   Intra-op Plan:   Post-operative Plan:   Informed Consent: I have reviewed the patients History and Physical, chart, labs and discussed the procedure including the risks, benefits and alternatives for the proposed anesthesia with the patient or authorized representative who has indicated his/her understanding and acceptance.       Plan Discussed with:   Anesthesia Plan Comments:         Anesthesia Quick Evaluation

## 2019-11-03 NOTE — Anesthesia Procedure Notes (Signed)
Procedure Name: Intubation Date/Time: 11/03/2019 2:25 PM Performed by: Lynden Oxford, CRNA Pre-anesthesia Checklist: Patient identified, Emergency Drugs available, Suction available and Patient being monitored Patient Re-evaluated:Patient Re-evaluated prior to induction Oxygen Delivery Method: Circle system utilized Preoxygenation: Pre-oxygenation with 100% oxygen Induction Type: IV induction Ventilation: Mask ventilation without difficulty Laryngoscope Size: McGraph and 3 Grade View: Grade I Tube type: Oral Tube size: 7.5 mm Number of attempts: 1 Airway Equipment and Method: Stylet,  Oral airway and Video-laryngoscopy Placement Confirmation: ETT inserted through vocal cords under direct vision,  positive ETCO2 and breath sounds checked- equal and bilateral Secured at: 20 cm Tube secured with: Tape Dental Injury: Teeth and Oropharynx as per pre-operative assessment

## 2019-11-03 NOTE — Op Note (Signed)
Indications: Susan Tran is a 24 yo female who presented with pars defect of the lumbar spine.  He failed conservative management and elected for surgical intervention.  Findings: partial correction of spondylolisthesis, presence of pars defect.  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   Preoperative Note:   Risks of surgery discussed include: infection, bleeding, stroke, coma, death, paralysis, CSF leak, nerve/spinal cord injury, numbness, tingling, weakness, complex regional pain syndrome, recurrent stenosis and/or disc herniation, vascular injury, development of instability, neck/back pain, need for further surgery, persistent symptoms, development of deformity, and the risks of anesthesia. The patient understood these risks and agreed to proceed.  Operative Note:  1. Transforaminal Lumbar Interbody Fusion L5/S1 2. Posterolateral arthrodesis L5 to LS1 3. Posterior nonsegmental instrumentation L5 to S1 using Globus Creo 4. Harvesting of autograft via the same incision 5. Placement of a biomechanical device ([place]) at L5/S1 for anterior arthrodesis 6. Bone Marrow Aspiration  The patient was brought to the Operating Room, intubated and turned into the prone position. All pressure points were checked and double checked. Flouroscopy was used to mark bilateral [place] incisions. The patient was prepped and draped in the standard fashion. A full timeout was performed. Preoperative antibiotics were given. The incisions were injected with local anesthetic.  A small stab incision was made on the left, then a needle was used to aspirate 60 ml of bone marrow from the left iliac crest via a separate incision.  This was handed off for preparation as autograft.  We opened the right incision, then made a medial  fascial incision for the TLIF.   Metrx dilators were advanced and a 26x40 mm tube was placed on the right at L5/s1. The microscope was brought into the field. An obvious pars defect was noted. The right L5/S1 facet was removed with osteotomes and the drill, and handed off for preparation as autograft. The traversing and exiting nerve roots on the right were identified and protected. The disc was opened using a scalpel. After incising the disc space, we took a combination of pituitary rongeurs, Kerrison rongeurs, disc scrapers, and curettes to remove a majority of the disc material.  We prepared the end plates for accepting the interbody fusion.  We removed the cartilaginous plate, preserved the cortical endplate if possible during this procedure.  The disc space was irrigated.  The Globus Sable TLIF biomechanical device (7-14 mm height x 10 mm width x 26 mm length, 15 degree lordotic) was inserted using flouroscopy, then backfilled with a mixture of allograft and autograft. Bone marrow aspirate was also placed. During placement, the nerve roots and dural sac were carefully protected without any leaks identified. After placement of the device, the tubular retractor was removed.  The left incision was opened with a scalpel, bovie electrocautery was used to open the fascia. Using AP flouroscopy, a Jamsheedi needle was placed at the center portion of the L5 pedicle radiographically. Triggered EMG was used during pedicle cannulation, without any readings below a 12 mAmp threshold during cannulation. The stylet was removed, and a K wire placed and secured.   We then moved to S1. Using AP flouroscopy, a Jamsheedi needle was placed at the center portion of the S1 pedicle radiographically. Triggered EMG was used during pedicle cannulation, without any readings below a 10 mAmp threshold during cannulation. The stylet was  removed, and a K wire placed and secured. Each S1 screw was checked in an en fosse view where lack  of medial breach was confirmed.  After placement of all K wires, lateral flouroscopy was used to confirm placement in the pedicles. We then placed Globus Creo 6.5x40 at L5 bilaterally and left S1, and 6.5x45 at right S1.   Rods measured and placed. The rods were secured using locking caps to manufacturer's specifications. Final AP and lateral radiographs were taken to confirm placement of instrumentation and appropriate alignment. The wound was copiously irrigated, then additional bone graft was placed over the posterior elements on the left.   After hemostasis, the wound was closed in layers with 0 and 2-0 vicryl. 3-0 monocryl and dermabond was applied to the incision. A sterile dressing was placed.  The patient was then flipped supine and extubated with incident. All counts were correct times 2 at the end of the case. No immediate complications were noted.  Monitoring was stable throughout the case.  Susan Berthold NP assisted in the entire procedure.

## 2019-11-03 NOTE — H&P (Signed)
History of Present Illness: 11/03/2019 Susan Tran presents today for surgical intervention.    Interval history 09/22/2019 Since initial visit, he has received physical therapy and epidural steroid injection. He has also quit all nicotine products. He states that this helped, but back pain still persist. Also complains of numbness in bilateral lower extremities over the last month. Denies any lower extremity pain/weakness, bladder/bowel dysfunction, saddle paresthesia.  Initial HPI 05/14/2019 Susan Tran is a 24 y.o. adult transitioning to female who is a current vape user, presents with the chief complaint of back pain for approximately 11 years worsening over time. Pain is constant but variable in severity. Currently rated 7/10; at best 5/10; at worst 10/10. Back pain is described as sharp, shocking, throbbing pain diffusely through the lumbar spine. Also complains of bilateral hip pain, lateral knee pain, and right anterior thigh pain (worst). Pain currently rated 7/10; at best 5/10; worst 10/10. Symptoms are aggravated by everything and only rare instances does not experience pain. Symptoms are relieved with sleep. Medication management includes Aleve, ibuprofen, and muscle relaxer. Denies bladder/bowel dysfunction, saddle paresthesia, recent fall/trauma, lower extremity weakness.  He has not received epidural steroid injections for current complaint He has received physical therapy for current complaint approximately 3 years ago. No benefit noted. Denies previous spinal surgery.  Review of Systems:  A 10 point review of systems is negative, except for the pertinent positives and negatives detailed in the HPI.  Past Medical History: Past Medical History:  Diagnosis Date  . Allergic rhinitis due to allergen  . Asthma without status asthmaticus, unspecified  . Depression  with suicide attempts, self, harm, inpatient admission 2014   Past Surgical History: No past surgical history on  file.   Allergies  Allergen Reactions  . Cephalosporins Other (See Comments)    "very sick,flu like symptoms"  . Penicillins Other (See Comments)    "very sick, flu like symptoms"   Current Meds  Medication Sig  . ibuprofen (ADVIL,MOTRIN) 200 MG tablet Take 800 mg by mouth every 6 (six) hours as needed (migraine headaches.).   Marland Kitchen naproxen sodium (ALEVE) 220 MG tablet Take 440 mg by mouth 2 (two) times daily as needed (pain.).  Marland Kitchen testosterone cypionate (DEPOTESTOSTERONE CYPIONATE) 200 MG/ML injection Inject 0.6 ML IM q14 days.  Use a new vial for each injection, discard the remaining (Patient taking differently: Inject 140 mg into the muscle every 14 (fourteen) days. Inject 0.6 ML IM q14 days.  Use a new vial for each injection, discard the remaining)    Social History: Social History   Tobacco Use  . Smoking status: Current Every Day Smoker  . Smokeless tobacco: Never Used  . Tobacco comment: quit cigarettes March 2020, currently uses vape 3%  Vaping Use  . Vaping Use: Every day  Substance Use Topics  . Alcohol use: No  . Drug use: No   Family Medical History: Family History  Problem Relation Age of Onset  . High blood pressure (Hypertension) Brother  . Diabetes type II Maternal Grandmother  . High blood pressure (Hypertension) Maternal Grandfather  . Bipolar disorder Maternal Grandfather  . High blood pressure (Hypertension) Paternal Grandmother  . Anxiety Paternal Grandmother  . Depression Paternal Grandmother   Physical Examination:  Today's Vitals   11/03/19 1240  BP: 124/85  Pulse: 92  Resp: 16  Temp: 97.7 F (36.5 C)  TempSrc: Tympanic  SpO2: 100%  PainSc: 5    There is no height or weight on file to calculate BMI.  Heart sounds normal no MRG. Chest Clear to Auscultation Bilaterally.     Gen. appearance: Patient is well-developed, well-nourished, in no apparent distress. Alert and cooperative.  Head: Normocephalic, atraumatic Heart: Normal, regular  rate Lungs: Breathing without difficulty Extremities: No edema noted in lower extremities bilaterally Skin: No abnormal skin lesions noted on limited exam of exposed skin Psychiatric: Patient is non-anxious, normal affect  NEUROLOGIC EXAM: Mental status: Alert and orientation to person, place, and time. Speech: fluent and clear Cranial nerves:  II: no ptosis V/VII:no evidence of facial droop  Motor:  Strength:  Side Iliopsoas Quads Hamstring Plantar Flexion Dorsiflexion Extensor Hallicus Longus  R 5 5 5 5 5 5   L 5 5 5 5 5 5    Sensory: Decrease sensation right anterior lateral and posterior calf, decreased sensation right anterior medial and posterior thigh. Decreased sensation right buttock.  Reflexes: Lower extremity reflexes are 3+ and symmetric at the patella and achilles. Clonus is absent.  Gait:antalgic, no difficulty with tandem gait.  ROM of spine: Mildly limited flexion and extension due to pain Palpation of spine: Tenderness midline low lumbar spine, numbness low right flank   Medical Decision Making  Imaging: EXAM:  MRI LUMBAR SPINE WITHOUT CONTRAST  06/13/2019  IMPRESSION:  1. Chronic bilateral pars defects at L5 with associated 5 mm  spondylolisthesis and resultant mild bilateral L5 foraminal  stenosis.  2. Otherwise normal MRI of the lumbar spine.   EXAM: MRI LUMBAR SPINE WITHOUT CONTRAST 12/11/2015  IMPRESSION: Abnormal appearance of the L5 pars. On the left, pars appears elongated/thin but grossly intact. On the right, cannot confirm intact pars. Dysplastic lamina/spinous process. 5 mm anterior slip L5 with mild uncovering of the disc. Mild L5-S1 foraminal narrowing greater on the left with mild contact with the exiting L5 nerve root. No significant spinal stenosis.  MRI 06/13/19 IMPRESSION: 1. Chronic bilateral pars defects at L5 with associated 5 mm spondylolisthesis and resultant mild bilateral L5 foraminal stenosis. 2. Otherwise normal MRI of  the lumbar spine.   Electronically Signed   By: Jeannine Boga M.D.   On: 06/13/2019 19:15   Assessment and Plan: [name] has an L5 bilateral pars defect. He has anterolisthesis of L5 on S1 with significant back pain. He has tried physical therapy as well as injections without improvement. Based on his lack of improvement, I recommended L5-S1 transforaminal lumbar interbody fusion. I think he is a good candidate for this. I do not think that further conservative management is indicated.   Meade Maw MD, [facility] Department of Neurosurgery

## 2019-11-04 ENCOUNTER — Inpatient Hospital Stay: Payer: BC Managed Care – PPO

## 2019-11-04 MED ORDER — OXYCODONE HCL 5 MG PO TABS
10.0000 mg | ORAL_TABLET | ORAL | Status: DC | PRN
  Administered 2019-11-04 (×2): 15 mg via ORAL
  Filled 2019-11-04 (×2): qty 3

## 2019-11-04 MED ORDER — KETOROLAC TROMETHAMINE 15 MG/ML IJ SOLN: 15.0000 mg | Freq: Four times a day (QID) | INTRAMUSCULAR | Status: DC | PRN

## 2019-11-04 MED ORDER — METHOCARBAMOL 500 MG PO TABS
750.0000 mg | ORAL_TABLET | Freq: Four times a day (QID) | ORAL | Status: DC
  Administered 2019-11-04 – 2019-11-05 (×5): 750 mg via ORAL
  Filled 2019-11-04 (×5): qty 2

## 2019-11-04 MED ORDER — CHLORHEXIDINE GLUCONATE CLOTH 2 % EX PADS: 6.0000 | MEDICATED_PAD | Freq: Every day | CUTANEOUS | Status: DC

## 2019-11-04 MED ORDER — SODIUM CHLORIDE 0.9 % IV SOLN
250.0000 mL | INTRAVENOUS | Status: DC
  Administered 2019-11-04: 250 mL via INTRAVENOUS

## 2019-11-04 MED ORDER — METHOCARBAMOL 1000 MG/10ML IJ SOLN
500.0000 mg | Freq: Four times a day (QID) | INTRAVENOUS | Status: DC
  Filled 2019-11-04: qty 5

## 2019-11-04 MED ORDER — FENTANYL CITRATE (PF) 100 MCG/2ML IJ SOLN: 25.0000 ug | INTRAMUSCULAR | Status: DC | PRN

## 2019-11-04 MED ORDER — GABAPENTIN 600 MG PO TABS
300.0000 mg | ORAL_TABLET | Freq: Three times a day (TID) | ORAL | Status: DC
  Administered 2019-11-04 – 2019-11-05 (×2): 300 mg via ORAL
  Filled 2019-11-04 (×4): qty 0.5

## 2019-11-04 MED ORDER — KETOROLAC TROMETHAMINE 15 MG/ML IJ SOLN
15.0000 mg | Freq: Four times a day (QID) | INTRAMUSCULAR | Status: DC
  Administered 2019-11-04 – 2019-11-05 (×3): 15 mg via INTRAVENOUS
  Filled 2019-11-04 (×3): qty 1

## 2019-11-04 MED ORDER — ACETAMINOPHEN 325 MG PO TABS
650.0000 mg | ORAL_TABLET | Freq: Four times a day (QID) | ORAL | Status: DC
  Administered 2019-11-04 – 2019-11-05 (×5): 650 mg via ORAL
  Filled 2019-11-04 (×5): qty 2

## 2019-11-04 NOTE — Progress Notes (Signed)
  Shift Summary: Patient ambulated with PT and RN, tolerated well. Vital signs stable but slightly low with SBP on 90s - low 100s. Patient is a/o x 4, Rm Air, No Tele, standby assist x 1 w/ walker. IVF Continued NS @ 75 mL, PRN and scheduled pain med admin. Voids and no bm since before surgery as of yet. Honeycomb dressings, clean dry and intact to low back, will continue to monitor.

## 2019-11-04 NOTE — Progress Notes (Addendum)
Procedure: L5-S1 TLIF Procedure date: 11/03/2019 Diagnosis: Pars defect of lumbar spine   History: Susan Tran is s/p L5-S1 TLIF POD1: [name] is evaluated this morning after having increased pain overnight. Otherwise, he denies any new onset numbness, tingling, lower extremity weakness. He reports that he had IV dilaudid and this made him ill.   Physical Exam: Vitals:   11/04/19 1825 11/04/19 1940  BP: (!) 104/47 (!) 94/50  Pulse: 67 71  Resp: 16 15  Temp:  98.6 F (37 C)  SpO2:  99%    General: Alert and oriented, lying in bed Strength:5/5 throughout  Sensation: diffuse numbness and tingling throughout lower extremities however improved since preoperatively. Skin: Incisions to lower back are clean, dry, intact.  Data:  No results for input(s): NA, K, CL, CO2, BUN, CREATININE, LABGLOM, GLUCOSE, CALCIUM in the last 168 hours. No results for input(s): AST, ALT, ALKPHOS in the last 168 hours.  Invalid input(s): TBILI   No results for input(s): WBC, HGB, HCT, PLT in the last 168 hours. No results for input(s): APTT, INR in the last 168 hours.         Assessment/Plan:  Susan Tran is POD1 s/p L5-S1 TLIF .   - Wishes to stay overnight for pain management and ambulation. - Plan to work with PT today - Remove foley catheter this morning and trial of void. - Post operative Xrays to be completed - Changed pain medications for improved comfort, added Toradol prn, increased oxycodone.     Susan Berthold, NP Department of Neurosurgery

## 2019-11-04 NOTE — Plan of Care (Signed)
Patient is resting quietly at this time. Will continue to monitor.

## 2019-11-04 NOTE — Progress Notes (Signed)
Ch visited with Pt as part of routine rounding. Pt looked tired. Pt said that he did not get any rest last night because of some medication. Pt explained his lumbar condition to Ch, and about surgery. Pt said that he is waiting for Physical therapy to come in, so he can complete it and get a nap. Pt said he was to go home sooner, but is still here because he was unable to move. Pt did not want any prayer. Pt is waiting to get some rest.

## 2019-11-04 NOTE — Progress Notes (Signed)
Patient to X-ray with transport.

## 2019-11-04 NOTE — Evaluation (Signed)
Physical Therapy Evaluation Patient Details Name: Susan Tran MRN: [id number] DOB: Nov 22, 1995 Today's Date: 11/04/2019   History of Present Illness  admitted for acute hospitalization s/p L5-S1 TLIF (11/03/19)  Clinical Impression  Patient awake, resting in bed upon arrival to room; alert and oriented to all information.  Follows commands and agreeable to OOB activities.  Does endorse difficulty with rest and pain control overnight, but reports improved pain levels with AM medication changes/administration (currently 6-7/10).  Bilat UE/LE strength and ROM grossly symmetrical and WFL; no focal weakness appreciated. Does report residual paresthesia throughout R LE (detects light touch, muted compared to L LE), though improved from before surgery.  Able to complete bed mobility with close sup; sit/stand, basic transfers and gait (200') with RW, cga/min assist.  Demonstrates reciprocal stepping pattern with improved mechanics throughout distance; min cuing for postural extension; mod WBing bilat UEs. Slow, but steady, cadence and overall gait speed (10' walk time, 12-13 seconds); limited primarily by pain. Would benefit from skilled PT to address above deficits and promote optimal return to PLOF.; recommend discharge to home with follow up services as arranged by therapist upon discharge from acute hospitalization.     Follow Up Recommendations Follow surgeon's recommendation for DC plan and follow-up therapies    Equipment Recommendations  Rolling walker with 5" wheels;3in1 (PT)    Recommendations for Other Services       Precautions / Restrictions Precautions Precautions: Fall;Back Restrictions Weight Bearing Restrictions: No      Mobility  Bed Mobility Overal bed mobility: Needs Assistance Bed Mobility: Supine to Sit     Supine to sit: Supervision Sit to supine: Min assist   General bed mobility comments: cuing for log rolling; heavy use of bedrails/UE  support  Transfers Overall transfer level: Needs assistance Equipment used: Rolling walker (2 wheeled) Transfers: Sit to/from Stand Sit to Stand: Min guard;Min assist         General transfer comment: cuing for hand placement; increased time/effort to complete  Ambulation/Gait Ambulation/Gait assistance: Min guard Gait Distance (Feet): 200 Feet Assistive device: Rolling walker (2 wheeled) Gait Pattern/deviations: Step-through pattern;Decreased step length - right;Decreased step length - left Gait velocity: 10' walk time, 12-13 seconds   General Gait Details: reciprocal stepping pattern with improved mechanics throughout distance; min cuing for postural extension; mod WBing bilat UEs  Stairs Stairs: Yes Stairs assistance: Min guard;Min assist Stair Management: Two rails;Step to pattern;Forwards Number of Stairs: 4 General stair comments: slow but no buckling noted.  Wheelchair Mobility    Modified Rankin (Stroke Patients Only)       Balance Overall balance assessment: Needs assistance Sitting-balance support: Feet supported;No upper extremity supported Sitting balance-Leahy Scale: Good     Standing balance support: Bilateral upper extremity supported Standing balance-Leahy Scale: Fair Standing balance comment: heavy reliance on walker                             Pertinent Vitals/Pain Pain Assessment: 0-10 Pain Score: 6  Faces Pain Scale: Hurts whole lot Pain Location: back Pain Descriptors / Indicators: Constant;Sore;Grimacing Pain Intervention(s): Limited activity within patient's tolerance;Monitored during session;Repositioned;Patient requesting pain meds-RN notified;RN gave pain meds during session    Home Living Family/patient expects to be discharged to:: Private residence Living Arrangements: Alone Available Help at Discharge: Friend(s);Available PRN/intermittently Type of Home: Apartment Home Access: Stairs to enter Entrance Stairs-Rails:  Left Entrance Stairs-Number of Steps: full flight (landing in the middle) Home Layout: One level  Home Equipment: Gilmer Mor - single point      Prior Function Level of Independence: Independent         Comments: Indep with ADLs, household and community mobilization without assist device; recent use of SPC due to LE weakness/pain.  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  Denies fall history.     Hand Dominance        Extremity/Trunk Assessment   Upper Extremity Assessment Upper Extremity Assessment: Overall WFL for tasks assessed    Lower Extremity Assessment Lower Extremity Assessment: Overall WFL for tasks assessed (does endorse some persistent paresthesia to R LE (able to detect light touch, but muted compared to L LE))       Communication   Communication: No difficulties  Cognition Arousal/Alertness: Awake/alert Behavior During Therapy: WFL for tasks assessed/performed Overall Cognitive Status: Within Functional Limits for tasks assessed                                        General Comments      Exercises Other Exercises Other Exercises: Educated in role of PT and progressive mobility in acute setting; reviewed back precautions and mobility implications; reviewed use of RW and safety needs.  Patient voiced understanding of all infomration. Other Exercises: Upper body dressing, set up/sup in sitting position; lower body dressing, mod assist to thread LEs (educated in hemi-dressing techniques for pain control and adherence to back prec), min assist fo rsit/stand and standing balance to pull pants over hips   Assessment/Plan    PT Assessment Patient needs continued PT services  PT Problem List Decreased activity tolerance;Decreased balance;Decreased mobility;Decreased strength;Decreased knowledge of use of DME;Decreased safety awareness;Decreased knowledge of precautions;Pain       PT Treatment Interventions DME instruction;Gait training;Stair  training;Functional mobility training;Therapeutic activities;Therapeutic exercise;Balance training;Patient/family education    PT Goals (Current goals can be found in the Care Plan section)  Acute Rehab PT Goals Patient Stated Goal: to stay another night before I go home so that the pain is better controlled PT Goal Formulation: With patient Time For Goal Achievement: 11/18/19 Potential to Achieve Goals: Good    Frequency BID   Barriers to discharge        Co-evaluation               AM-PAC PT "6 Clicks" Mobility  Outcome Measure Help needed turning from your back to your side while in a flat bed without using bedrails?: A Little Help needed moving from lying on your back to sitting on the side of a flat bed without using bedrails?: A Little Help needed moving to and from a bed to a chair (including a wheelchair)?: A Little Help needed standing up from a chair using your arms (e.g., wheelchair or bedside chair)?: A Little Help needed to walk in hospital room?: A Little Help needed climbing 3-5 steps with a railing? : A Little 6 Click Score: 18    End of Session Equipment Utilized During Treatment: Gait belt Activity Tolerance: Patient limited by pain Patient left: in chair;with call bell/phone within reach;with chair alarm set Nurse Communication: Mobility status PT Visit Diagnosis: Difficulty in walking, not elsewhere classified (R26.2);Muscle weakness (generalized) (M62.81);Pain    Time: 6503-5465 PT Time Calculation (min) (ACUTE ONLY): 31 min   Charges:   PT Evaluation $PT Eval Moderate Complexity: 1 Mod PT Treatments $Gait Training: 8-22 mins $Therapeutic Activity: 8-22 mins  Ermina Oberman H. Owens Shark, PT, DPT, NCS 11/04/19, 5:09 PM [phone]

## 2019-11-04 NOTE — Progress Notes (Signed)
Physical Therapy Treatment Patient Details Name: Susan Tran MRN: [id number] DOB: 1996-03-28 Today's Date: 11/04/2019    History of Present Illness      PT Comments    Pt in chair.  Fatigued and ready for a nap but agrees to stair training.  Stood with min a x 1 and is able to walk to/from rehab gym with RW and min guard/assit +1.  Completed stairs with B rails as at home.  While he was able to complete lap this am, declines further gait due to pain and fatigue.   Pt stated he has friends staying with him to assist with mobility as needed.     Follow Up Recommendations  Follow surgeon's recommendation for DC plan and follow-up therapies     Equipment Recommendations  Rolling walker with 5" wheels;3in1 (PT)    Recommendations for Other Services       Precautions / Restrictions Precautions Precautions: Back    Mobility  Bed Mobility Overal bed mobility: Needs Assistance Bed Mobility: Sit to Supine       Sit to supine: Min assist   General bed mobility comments: good recall of log rolling  Transfers Overall transfer level: Needs assistance Equipment used: Rolling walker (2 wheeled) Transfers: Sit to/from Stand Sit to Stand: Min guard;Min assist            Ambulation/Gait Ambulation/Gait assistance: Min guard Gait Distance (Feet): 60 Feet Assistive device: Rolling walker (2 wheeled) Gait Pattern/deviations: Step-through pattern;Decreased step length - right;Decreased step length - left Gait velocity: decreased   General Gait Details: heavy relaince on RW for support but no LOB or buckling noted   Stairs Stairs: Yes Stairs assistance: Min guard;Min assist Stair Management: Two rails;Step to pattern;Forwards Number of Stairs: 4 General stair comments: slow but no buckling noted.   Wheelchair Mobility    Modified Rankin (Stroke Patients Only)       Balance Overall balance assessment: Needs assistance Sitting-balance support: Feet  supported Sitting balance-Leahy Scale: Good     Standing balance support: Bilateral upper extremity supported Standing balance-Leahy Scale: Fair Standing balance comment: heavy reliance on walker                            Cognition Arousal/Alertness: Awake/alert Behavior During Therapy: WFL for tasks assessed/performed Overall Cognitive Status: Within Functional Limits for tasks assessed                                        Exercises      General Comments        Pertinent Vitals/Pain Pain Assessment: Faces Faces Pain Scale: Hurts whole lot Pain Location: back Pain Descriptors / Indicators: Constant;Sore;Grimacing Pain Intervention(s): Limited activity within patient's tolerance;Monitored during session;Patient requesting pain meds-RN notified    Home Living                      Prior Function            PT Goals (current goals can now be found in the care plan section) Progress towards PT goals: Progressing toward goals    Frequency    BID      PT Plan Current plan remains appropriate    Co-evaluation              AM-PAC PT "6 Clicks" Mobility   Outcome Measure  Help needed turning from your back to your side while in a flat bed without using bedrails?: A Little Help needed moving from lying on your back to sitting on the side of a flat bed without using bedrails?: A Little Help needed moving to and from a bed to a chair (including a wheelchair)?: A Little Help needed standing up from a chair using your arms (e.g., wheelchair or bedside chair)?: A Little Help needed to walk in hospital room?: A Little Help needed climbing 3-5 steps with a railing? : A Little 6 Click Score: 18    End of Session Equipment Utilized During Treatment: Gait belt Activity Tolerance: Patient limited by pain Patient left: in bed;with call bell/phone within reach;with bed alarm set Nurse Communication: Mobility status        Time: 1319-1330 PT Time Calculation (min) (ACUTE ONLY): 11 min  Charges:  $Gait Training: 8-22 mins                    Danielle Dess, PTA 11/04/19, 2:14 PM

## 2019-11-04 NOTE — TOC Transition Note (Signed)
Transition of Care [facility]) - CM/SW Discharge Note   Patient Details  Name: Susan Tran MRN: [id number] Date of Birth: [birth year]  Transition of [facility]) CM/SW Contact:  Elease Hashimoto, LCSW Phone Number: 11/04/2019, 2:45 PM   Clinical Narrative:  Met with pt to discuss discharge needs. He is agreeable to a RW and feels a 3 in 1 is not needed. He lives with friends who he reports will help him if needed. He plans on being independent and taking care of himself. He was driving and working prior to admission but back issues have limited him. He is hopeful this will change once he heals from surgery. Pt wold benefit from OPPT if needed due to young age. He is agreeable to this and will talk with MD regarding this once he feels he is ready for this. [employer] to deliver RW today to his room so if DC will be ready to go home. Pt is motivated to do well and recover. No further needs or follow due to possible DC today.      Barriers to Discharge: No Barriers Identified   Patient Goals and CMS Choice Patient states their goals for this hospitalization and ongoing recovery are:: I want to get a nap then hopefully when MD comes back go home. CMS Medicare.gov Compare Post Acute Care list provided to:: Patient Choice offered to / list presented to : Patient  Discharge Placement                       Discharge Plan and Services In-house Referral: Clinical Social Work   Post Acute Care Choice: Durable Medical Equipment          DME Arranged: Gilford Rile rolling DME Agency: [employer] Date DME Agency Contacted: 11/04/19 Time DME Agency Contacted: 0737 Representative spoke with at DME Agency: Corene Cornea            Social Determinants of Health ([place]) Interventions     Readmission Risk Interventions No flowsheet data found.

## 2019-11-04 NOTE — Progress Notes (Signed)
Procedure: L5-S1 TLIF Procedure date: 11/03/2019 Diagnosis: Pars defect of lumbar spine   History: Susan Tran is s/p L5-S1 TLIF POD0: Susan Tran is seen in PACU, alert, oriented, moving all extremities. Endorses anxiety.   Physical Exam: Vitals:   11/04/19 1825 11/04/19 1940  BP: (!) 104/47 (!) 94/50  Pulse: 67 71  Resp: 16 15  Temp:  98.6 F (37 C)  SpO2:  99%    General: Alert and oriented, lying in bed Strength:5/5 throughout  Sensation: diffuse numbness and tingling throughout lower extremities however improved since preoperatively. Skin: Incisions to lower back are clean, dry, intact.  Data:  No results for input(s): NA, K, CL, CO2, BUN, CREATININE, LABGLOM, GLUCOSE, CALCIUM in the last 168 hours. No results for input(s): AST, ALT, ALKPHOS in the last 168 hours.  Invalid input(s): TBILI   No results for input(s): WBC, HGB, HCT, PLT in the last 168 hours. No results for input(s): APTT, INR in the last 168 hours.         Assessment/Plan:  Susan Tran is POD0 s/p L5-S1 TLIF .   Will admit to inpatient unit for pain management and mobility.    Patsey Berthold, NP Department of Neurosurgery

## 2019-11-05 MED ORDER — ACETAMINOPHEN 325 MG PO TABS: 650.0000 mg | ORAL_TABLET | Freq: Four times a day (QID) | ORAL | Status: AC

## 2019-11-05 MED ORDER — CELECOXIB 100 MG PO CAPS: 100.0000 mg | ORAL_CAPSULE | Freq: Two times a day (BID) | ORAL | 1 refills | Status: AC

## 2019-11-05 MED ORDER — OXYCODONE HCL 5 MG PO TABS: 5.0000 mg | ORAL_TABLET | ORAL | 0 refills | Status: DC | PRN

## 2019-11-05 MED ORDER — SENNA 8.6 MG PO TABS: 1.0000 | ORAL_TABLET | Freq: Two times a day (BID) | ORAL | 0 refills | Status: AC

## 2019-11-05 MED ORDER — POLYETHYLENE GLYCOL 3350 17 G PO PACK: 17.0000 g | PACK | Freq: Every day | ORAL | 0 refills | Status: AC

## 2019-11-05 MED ORDER — METHOCARBAMOL 750 MG PO TABS: 750.0000 mg | ORAL_TABLET | Freq: Four times a day (QID) | ORAL | 0 refills | Status: AC

## 2019-11-05 MED ORDER — OXYCODONE HCL 5 MG PO TABS: 5.0000 mg | ORAL_TABLET | ORAL | 0 refills | Status: AC | PRN

## 2019-11-05 MED ORDER — GABAPENTIN 600 MG PO TABS: 300.0000 mg | ORAL_TABLET | Freq: Three times a day (TID) | ORAL | 1 refills | Status: AC

## 2019-11-05 NOTE — Progress Notes (Signed)
Physical Therapy Treatment Patient Details Name: Susan Tran MRN: [id number] DOB: 14-Sep-1995 Today's Date: 11/05/2019    History of Present Illness admitted for acute hospitalization s/p L5-S1 TLIF (11/03/19)    PT Comments    Patient able to complete all mobility with less physical assist this date; much more comfortable and confident in mobility performance (performing at sup/mod indep level for all tasks).  Verbally reviewed back precautions with patient indep voicing 3/3; reviewed car transfer technique and issued handout (with written/pictorial descriptions) for standing LE therex HEP. Patient voiced understanding of all information; no additional questions.  Eager for upcoming discharge home.   Follow Up Recommendations  Follow surgeon's recommendation for DC plan and follow-up therapies     Equipment Recommendations  Rolling walker with 5" wheels;3in1 (PT)    Recommendations for Other Services       Precautions / Restrictions Precautions Precautions: Fall;Back Restrictions Weight Bearing Restrictions: No    Mobility  Bed Mobility Overal bed mobility: Modified Independent             General bed mobility comments: good carry-over and demonstration of log rolling technique  Transfers Overall transfer level: Modified independent Equipment used: Rolling walker (2 wheeled) Transfers: Sit to/from Stand Sit to Stand: Modified independent (Device/Increase time)         General transfer comment: does require UE support to assist with lift off and stabilization; slow and guarded, but able to complete without physical assist  Ambulation/Gait Ambulation/Gait assistance: Modified independent (Device/Increase time) Gait Distance (Feet): 200 Feet Assistive device: Rolling walker (2 wheeled)       General Gait Details: reciprocal stepping pattern with good step height/length; improved postural extension; improving cadence and overall gait  fluidity   Stairs Stairs: Yes Stairs assistance: Supervision Stair Management: One rail Left Number of Stairs: 8 General stair comments: step to gait pattern, leading with L LE; good LE strength/control   Wheelchair Mobility    Modified Rankin (Stroke Patients Only)       Balance Overall balance assessment: Needs assistance Sitting-balance support: No upper extremity supported;Feet supported Sitting balance-Leahy Scale: Normal     Standing balance support: Bilateral upper extremity supported Standing balance-Leahy Scale: Fair                              Cognition Arousal/Alertness: Awake/alert Behavior During Therapy: WFL for tasks assessed/performed Overall Cognitive Status: Within Functional Limits for tasks assessed                                        Exercises Other Exercises Other Exercises: Verbally reviewed back precautions with patient indep voicing 3/3; reviewed car transfer technique and issued handout (with written/pictorial descriptions) for standing LE therex HEP. Patient voiced understanding of all information; no additioanl questions.  Eager for upcoming discharge home.    General Comments        Pertinent Vitals/Pain Pain Assessment: Faces Faces Pain Scale: Hurts little more Pain Location: back Pain Descriptors / Indicators: Constant;Sore;Grimacing Pain Intervention(s): Limited activity within patient's tolerance;Monitored during session;Repositioned;Premedicated before session    Home Living                      Prior Function            PT Goals (current goals can now be found in the care plan  section) Acute Rehab PT Goals Patient Stated Goal: to go home and get in my own bed PT Goal Formulation: With patient Time For Goal Achievement: 11/18/19 Potential to Achieve Goals: Good Progress towards PT goals: Progressing toward goals    Frequency    BID      PT Plan Current plan remains  appropriate    Co-evaluation              AM-PAC PT "6 Clicks" Mobility   Outcome Measure  Help needed turning from your back to your side while in a flat bed without using bedrails?: None Help needed moving from lying on your back to sitting on the side of a flat bed without using bedrails?: None Help needed moving to and from a bed to a chair (including a wheelchair)?: None Help needed standing up from a chair using your arms (e.g., wheelchair or bedside chair)?: None Help needed to walk in hospital room?: None Help needed climbing 3-5 steps with a railing? : None 6 Click Score: 24    End of Session Equipment Utilized During Treatment: Gait belt Activity Tolerance: Patient tolerated treatment well Patient left:  (patient in bathroom for toileting needs; to call for assist as needed with return to bed (fall risk score does not require alarm, RN confirms patient has been toileting indep)) Nurse Communication: Mobility status PT Visit Diagnosis: Difficulty in walking, not elsewhere classified (R26.2);Muscle weakness (generalized) (M62.81);Pain     Time: 5456-2563 PT Time Calculation (min) (ACUTE ONLY): 10 min  Charges:  $Gait Training: 8-22 mins                    Deklynn Charlet H. Owens Shark, PT, DPT, NCS 11/05/19, 9:47 AM [phone]

## 2019-11-05 NOTE — Discharge Summary (Signed)
  Procedure: L5-S1 TLIF Procedure date: 11/03/2019 Diagnosis: pars defect   History: Susan Tran is s/p L5-S1 TLIF POD2: Susan Tran reports his night was better than the night before, and he feels that his pain is well controlled. He has ambulated and worked with PT, urinated, and is passing flatus. He is eager for discharge today.   Physical Exam: Vitals:   11/05/19 0023 11/05/19 0441  BP: (!) 117/53 (!) 96/50  Pulse: (!) 58 (!) 56  Resp: 15 15  Temp: (!) 97.5 F (36.4 C) (!) 97.5 F (36.4 C)  SpO2: 99% 99%    General: Alert and oriented, lying in bed Strength:5/5 throughout  Sensation: diffuse numbness throughout lower extremities but much improved since surgery Skin: Incisions to lower back clean, dry, intact.   Data:  No results for input(s): NA, K, CL, CO2, BUN, CREATININE, LABGLOM, GLUCOSE, CALCIUM in the last 168 hours. No results for input(s): AST, ALT, ALKPHOS in the last 168 hours.  Invalid input(s): TBILI   No results for input(s): WBC, HGB, HCT, PLT in the last 168 hours. No results for input(s): APTT, INR in the last 168 hours.         Assessment/Plan:  Susan Tran is POD2 s/p L5-S1 TLIF. He has met all discharge criteria and is read for discharge.    Discharge instructions reviewed with patient.  He will follow up in 2 weeks in clinic with me.   Lonell Face, NP Department of Neurosurgery

## 2019-11-05 NOTE — Discharge Instructions (Signed)
Your surgeon has performed an operation on your lumbar spine (low back) to fuse two or more of the vertebrae (bones) together. This procedure is performed to treat a number of different spinal problems, including narrowing of the spinal canal (stenosis), herniated discs, degenerative changes, and injuries.   Many times, patients feel better immediately after surgery and can "overdo it." Even if you feel well, it is important that you follow these activity guidelines. If you do not let your back heal properly from the surgery, you can increase the chance of return of your symptoms and other complications. The following are instructions to help in your recovery once you have been discharged from the hospital.   * Do not take anti-inflammatory medications for 3 months after surgery (naproxen [Aleve], ibuprofen [Advil, Motrin], etc.). These medications can prevent your bones from healing properly.   YOU MAY TAKE CELEBREX as ordered (100mg  twice daily). Do not take ibuprofen or naproxen or other NSAIDs while taking Celebrex.  Activity     No bending, lifting, or twisting ("BLT"). Avoid lifting objects heavier than 10 pounds (gallon milk jug).  Where possible, avoid household activities that involve lifting, bending, reaching, pushing, or pulling such as laundry, vacuuming, grocery shopping, and childcare. Try to arrange for help from friends and family for these activities while your back heals.   Increase physical activity slowly as tolerated.  Taking short walks is encouraged, but avoid strenuous exercise. Do not jog, run, bicycle, lift weights, or participate in any other exercises unless specifically allowed by your doctor. Avoid prolonged sitting, including car rides.   Talk to your doctor before resuming sexual activity.   You should not drive until cleared by your doctor.   Until released by your doctor, you should not return to work or school.  You should rest at home and let your body heal.    You may shower Sunday.  After showering, lightly dab your incision dry. Do not take a tub bath or go swimming until approved by your doctor at your follow-up appointment.   If your doctor ordered a lumbar brace for you, you should wear it whenever you are out of bed. You may remove it when lying down or sleeping. You should also wear it when riding in a car. Not all back surgeries require a lumbar brace.   If you smoke, we strongly recommend that you quit.  Smoking has been proven to interfere with normal bone healing and will dramatically reduce the success rate of your surgery. Please contact [facility] (800-QUIT-NOW) and use the resources at www.[facility].com for assistance in stopping smoking.   Surgical Incision   If you have a dressing on your incision, remove it Saturday. Keep your incision area clean and dry.   If you have staples or stitches on your incision, you should have a follow up scheduled for removal. If you do not have staples or stitches, you will have steri-strips (small pieces of surgical tape) or Dermabond glue. The steri-strips/glue should begin to peel away within about a week (it is fine if the steri-strips fall off before then). If the strips are still in place one week after your surgery, you may gently remove them.   Diet           You may return to your usual diet. Be sure to stay hydrated.   When to Contact Sunday   Although your surgery and recovery will likely be uneventful, you may have some residual numbness, aches, and pains in  your back and/or legs. This is normal and should improve in the next few weeks.   However, should you experience any of the following, contact us immediately:   - New numbness or weakness   - Pain that is progressively getting worse, and is not relieved by your pain medications or rest   - Bleeding, redness, swelling, pain, or drainage from surgical incision   - Chills or flu-like symptoms   - Fever greater than 101.0 F (38.3 C)    - Problems with bowel or bladder functions   - Difficulty breathing or shortness of breath   - Warmth, tenderness, or swelling in your calf   Contact Information   - During office hours (Monday-Friday 9 am to 5 pm), please call your physician at [phone]  - After hours and weekends, please call [phone] and an answering service will put you in touch with either Dr. Lacinda Axon or Dr. Izora Ribas.   - For a life-threatening emergency, call 911

## 2019-11-05 NOTE — Progress Notes (Signed)
Discharge instructions verbalized to patient and written copy of instructions given to patient, along with paper copy of pain prescription. Dressing to low back clean dry and intact.Patient denies any need or concerns at this time. PIVs removed, dressing and pressure applied. Patient discharged to home, per provider order, with friend/family. Patient transported via wheelchair along with personal belongings. To front lobby for ride home.

## 2019-11-05 NOTE — Plan of Care (Signed)

## 2019-11-07 NOTE — Anesthesia Postprocedure Evaluation (Signed)
Anesthesia Post Note  Patient: Susan Tran  Procedure(s) Performed: MINIMALLY INVASIVE (MIS) TRANSFORAMINAL LUMBAR INTERBODY FUSION (TLIF) 1 LEVEL L5-S1 (N/A ) BONE MARROW ASPIRATION FOR SPINE FUSION ONLY (BMAC) (N/A )  Patient location during evaluation: PACU Anesthesia Type: General Level of consciousness: awake and alert Pain management: pain level controlled Vital Signs Assessment: post-procedure vital signs reviewed and stable Respiratory status: spontaneous breathing, nonlabored ventilation, respiratory function stable and patient connected to nasal cannula oxygen Cardiovascular status: blood pressure returned to baseline and stable Postop Assessment: no apparent nausea or vomiting Anesthetic complications: no   No complications documented.   Last Vitals:  Vitals:   11/05/19 0441 11/05/19 0741  BP: (!) 96/50 114/65  Pulse: (!) 56 70  Resp: 15 18  Temp: (!) 36.4 C 36.6 C  SpO2: 99% 100%    Last Pain:  Vitals:   11/05/19 0741  TempSrc: Oral  PainSc:                  Yevette Edwards

## 2019-11-08 ENCOUNTER — Encounter: Payer: Self-pay | Admitting: Neurosurgery

## 2021-09-25 IMAGING — RF DG C-ARM 1-60 MIN
1 series · 15 of 18 positions shown · IV contrast (agent unspecified)
Comparison: MRI 06/13/2015

CLINICAL DATA: Posterior lumbar fusion

EXAM:
DG C-ARM 1-60 MIN
CONTRAST:  None
FLUOROSCOPY TIME:  Fluoroscopy Time:  1 minutes 24 seconds
Radiation Exposure Index (if provided by the fluoroscopic device):
20.2 mGy
Number of Acquired Spot Images: 18

[Series 1: dg no report - auto finalize · 0.20mm/px · 15 of 18 slices shown]
[im 1/18]
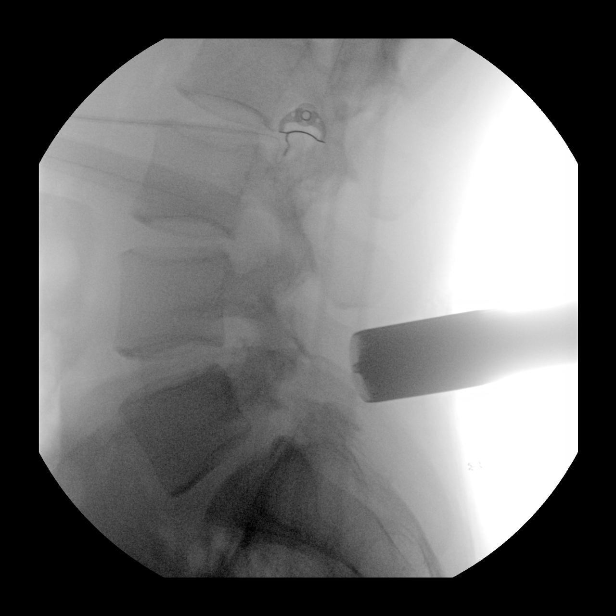
[im 2/18]
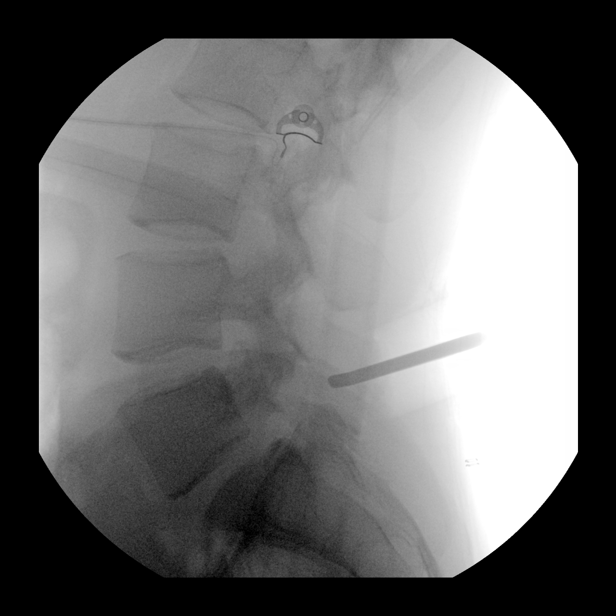
[im 4/18]
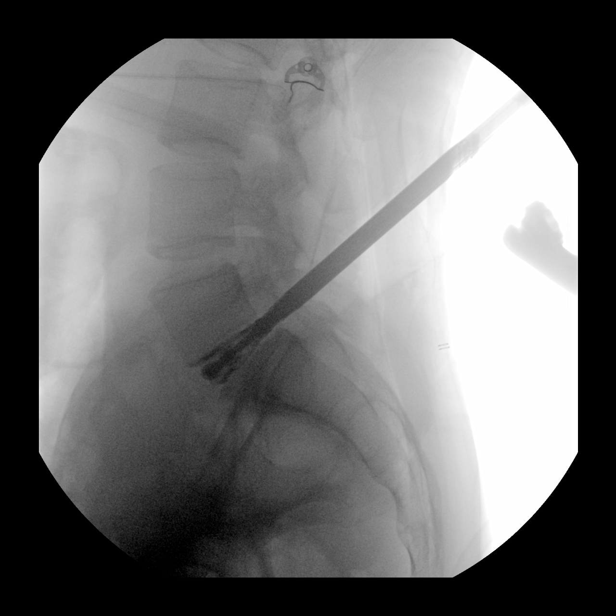
[im 5/18]
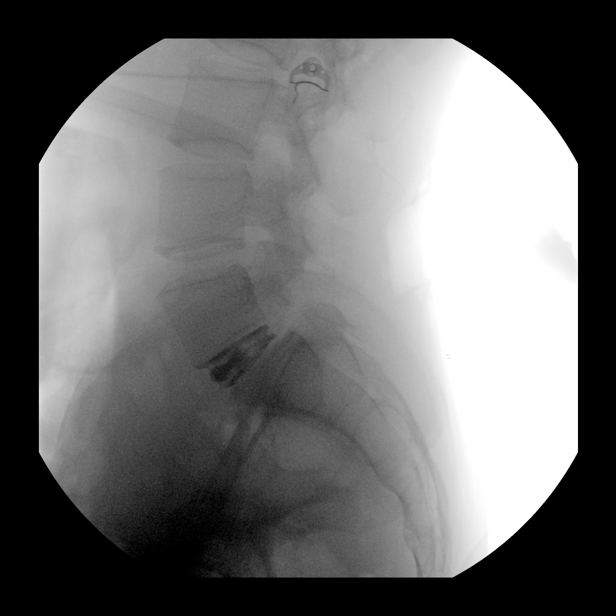
[im 6/18]
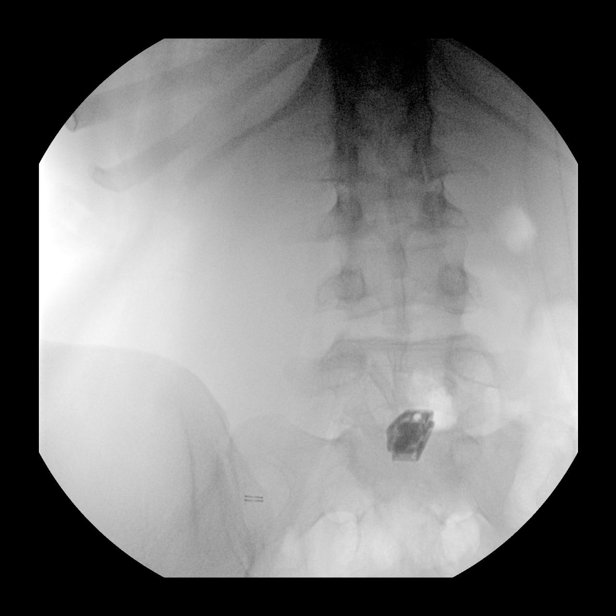
[im 7/18]
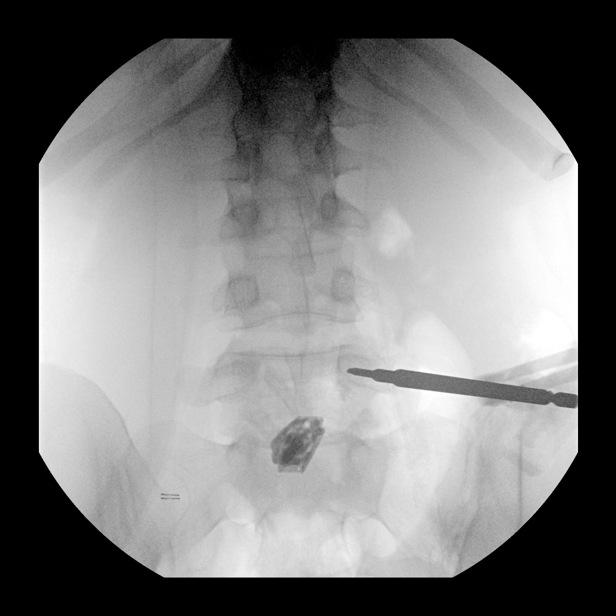
[im 8/18]
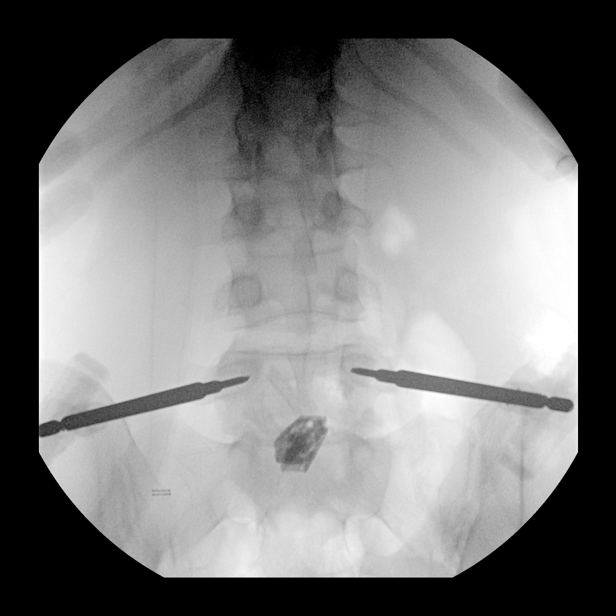
[im 10/18]
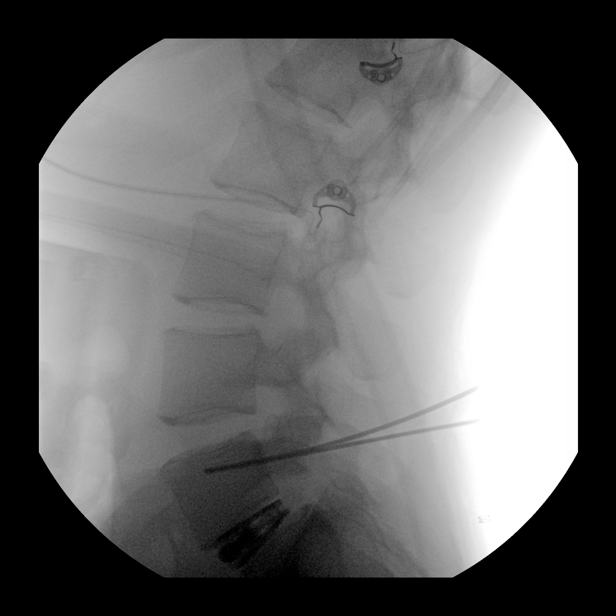
[im 11/18]
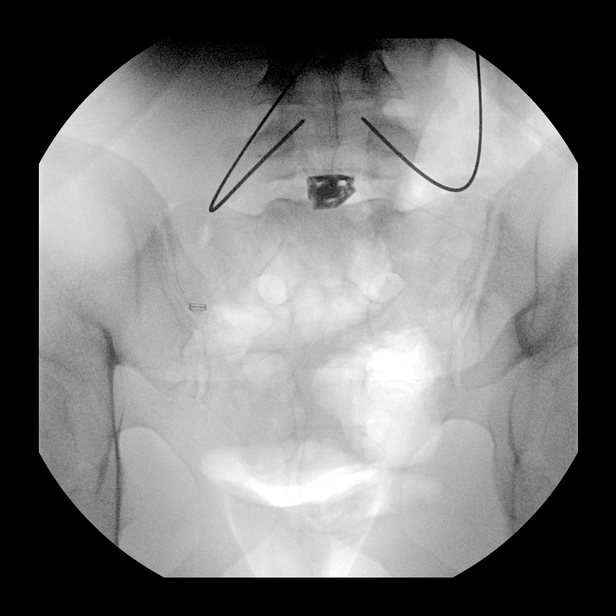
[im 12/18]
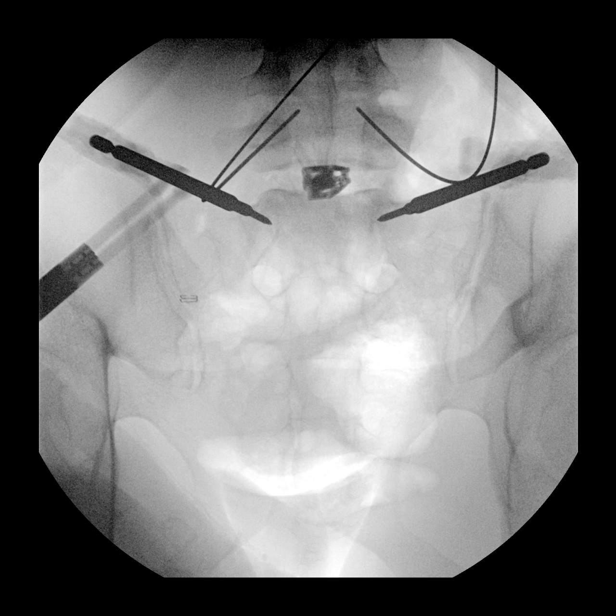
[im 13/18]
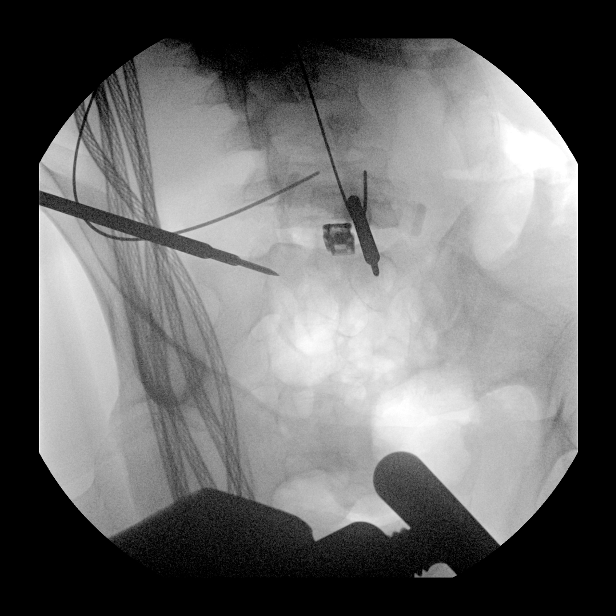
[im 14/18]
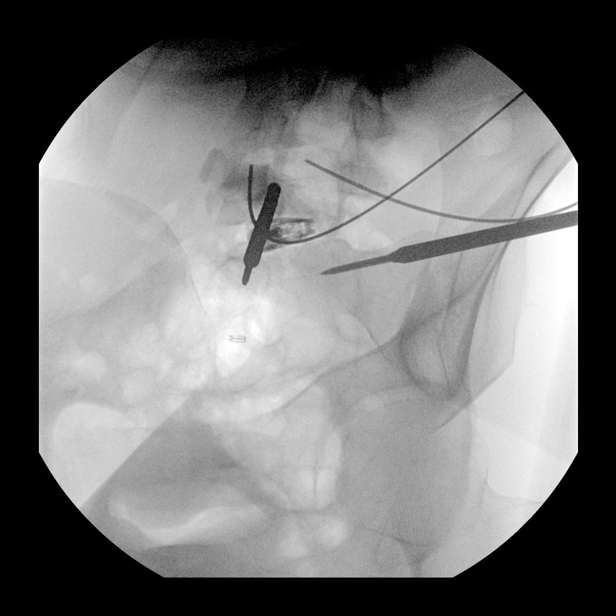
[im 16/18]
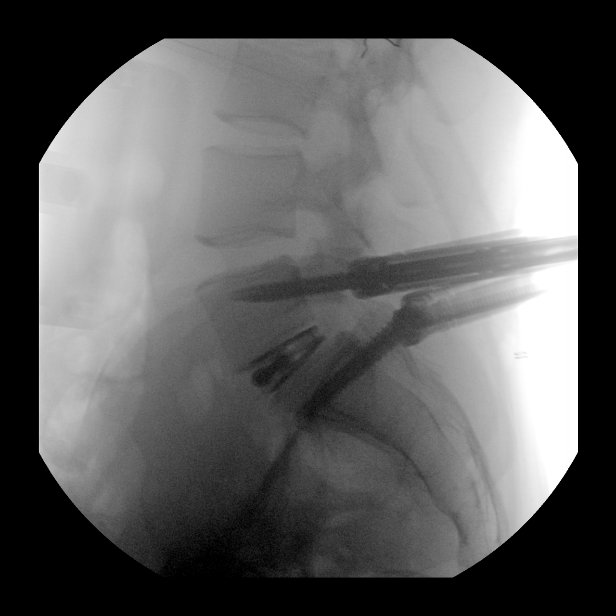
[im 17/18]
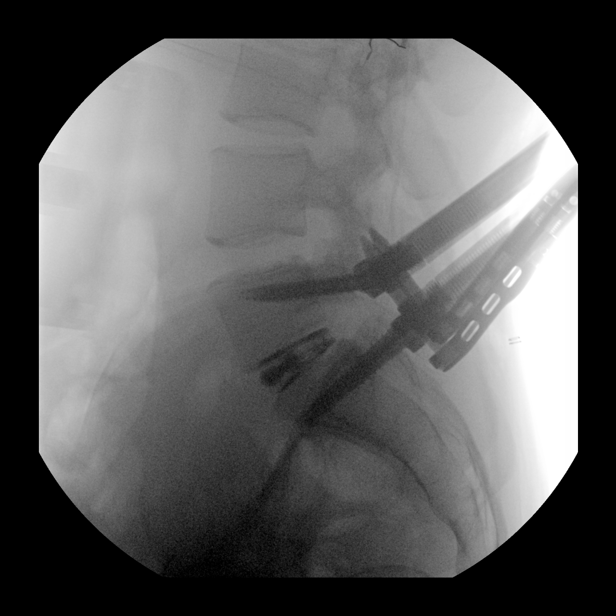
[im 18/18]
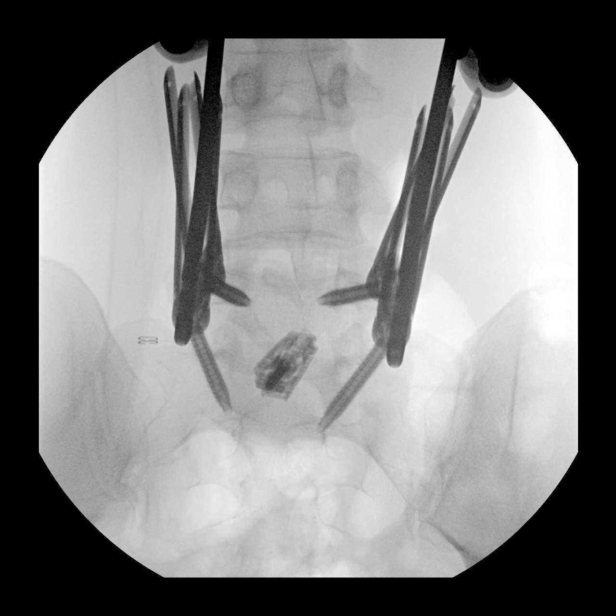

[15 of 18 positions shown; findings below may reference images not displayed]

FINDINGS: Multiple views of the lumbar spine provided. Posterior lumbar fusion
at L5-S1 with interbody spacer, pedicle screws and vertical fusion
rods.
IMPRESSION: Posterior lumbar fusion as above.

## 2022-05-27 HISTORY — PX: TONSILLECTOMY: SUR1361

## 2023-04-09 NOTE — H&P (Signed)
  Subjective Patient ID: Susan Tran is a 27 y.o. adult.     HPI   Returns for follow up discussion prior to gender affirming surgery. Patient has identified as female with preferred bame Susan Tran in all aspects of life for 8 years On testosterone managed by [facility] for 7 years.    Currently will use tape on occasion.   No prior MMG. Reports possible breast cancer in grandparent.    Lives with SO. Reports his own family not involved in his life but his SO's family is supportive. Works in [employer].   Review of Systems  Constitutional:  Positive for fatigue.  Musculoskeletal:  Positive for arthralgias, back pain and neck pain.  Neurological:  Positive for weakness and headaches.  Psychiatric/Behavioral:  Positive for dysphoric mood and sleep disturbance. The patient is nervous/anxious.   All other systems reviewed and are negative.         Objective Physical Exam  Cardiovascular: Normal rate, regular rhythm and normal heart sounds.    Pulmonary/Chest Effort normal and breath sounds normal.    Skin   Fitzpatrick 2      Lymph: no palpable axillary adenopathy   Breasts: no palpable masses, no ptosis SN to nipple R 21 L 22 cm BW R 18 L 18 cm Nipple to IMF R 7 L 6 cm       Assessment/Plan Gender dysphoria   At his age and in absence of FH do not require MMG prior to surgery. Discussed with patient mastectomy does not remove 100% breast tissue and will need to continue with self exams with aging with regards to breast cancer risk. Reviewed mastectomy will leave chest skin and nipple areola without feeling or ability to stimulate.    Discussed mastectomy periareolar approach. Reviewed scars, drains, OP sugery, post op limitation pain and recovery.  Reviewed anticipated scar length, scar maturation over months. Reviewed with this approach, limitations include less  ability to reposition NAC on chest  or reduce size areola and/or nipple. Alternative is  double incision with FNG. Reviewed anticipated lengths scars with this approach, reviewed graft will not have sensation nor stimulate. Benefit of FNG is able to place on chest consistent with cis female appearance. Patient has elected for areola incision.   Additional risks including but not limited to bleeding, hematoma, seroma, need for additional procedures, infection, wound healing problems, asymmetry, damage to adjacent structures, unacceptable cosmetic result, blood clots in legs or lungs, failure grafts vs necrosis nipple areola reviewed. Completed ASPS consent for gender affirming mastectomies.    Drain teaching completed. Rx for tramadol given.      Glenna Fellows, MD [facility]  Office/ physician access line after hours [phone]

## 2023-04-11 NOTE — Pre-Procedure Instructions (Signed)
Surgical Instructions   Your procedure is scheduled on Friday, November 22nd. Report to [facility] Main Entrance "A" at 09:00 A.M., then check in with the Admitting office. Any questions or running late day of surgery: call [phone]  Questions prior to your surgery date: call [phone], Monday-Friday, 8am-4pm. If you experience any cold or flu symptoms such as cough, fever, chills, shortness of breath, etc. between now and your scheduled surgery, please notify us at the above number.     Remember:  Do not eat after midnight the night before your surgery   You may drink clear liquids until 08:00 AM the morning of your surgery.   Clear liquids allowed are: Water, Non-Citrus Juices (without pulp), Carbonated Beverages, Clear Tea (no milk, honey, etc.), Black Coffee Only (NO MILK, CREAM OR POWDERED CREAMER of any kind), and Gatorade.    Take these medicines the morning of surgery with A SIP OF WATER  norethindrone (MICRONOR)  sertraline (ZOLOFT)     One week prior to surgery, STOP taking any Aspirin (unless otherwise instructed by your surgeon) Aleve, Naproxen, Ibuprofen, Motrin, Advil, Goody's, BC's, all herbal medications, fish oil, and non-prescription vitamins.                     Do NOT Smoke (Tobacco/Vaping) for 24 hours prior to your procedure.  If you use a CPAP at night, you may bring your mask/headgear for your overnight stay.   You will be asked to remove any contacts, glasses, piercing's, hearing aid's, dentures/partials prior to surgery. Please bring cases for these items if needed.    Patients discharged the day of surgery will not be allowed to drive home, and someone needs to stay with them for 24 hours.  SURGICAL WAITING ROOM VISITATION Patients may have no more than 2 support people in the waiting area - these visitors may rotate.   Pre-op nurse will coordinate an appropriate time for 1 ADULT support person, who may not rotate, to accompany patient in  pre-op.  Children under the age of 22 must have an adult with them who is not the patient and must remain in the main waiting area with an adult.  If the patient needs to stay at the hospital during part of their recovery, the visitor guidelines for inpatient rooms apply.  Please refer to the [facility] website for the visitor guidelines for any additional information.   If you received a COVID test during your pre-op visit  it is requested that you wear a mask when out in public, stay away from anyone that may not be feeling well and notify your surgeon if you develop symptoms. If you have been in contact with anyone that has tested positive in the last 10 days please notify you surgeon.      Pre-operative CHG Bathing Instructions   You can play a key role in reducing the risk of infection after surgery. Your skin needs to be as free of germs as possible. You can reduce the number of germs on your skin by washing with CHG (chlorhexidine gluconate) soap before surgery. CHG is an antiseptic soap that kills germs and continues to kill germs even after washing.   DO NOT use if you have an allergy to chlorhexidine/CHG or antibacterial soaps. If your skin becomes reddened or irritated, stop using the CHG and notify one of our RNs at [phone].              TAKE A SHOWER THE NIGHT  BEFORE SURGERY AND THE DAY OF SURGERY    Please keep in mind the following:  DO NOT shave, including legs and underarms, 48 hours prior to surgery.   You may shave your face before/day of surgery.  Place clean sheets on your bed the night before surgery Use a clean washcloth (not used since being washed) for each shower. DO NOT sleep with pet's night before surgery.  CHG Shower Instructions:  Wash your face and private area with normal soap. If you choose to wash your hair, wash first with your normal shampoo.  After you use shampoo/soap, rinse your hair and body thoroughly to remove shampoo/soap residue.  Turn  the water OFF and apply half the bottle of CHG soap to a CLEAN washcloth.  Apply CHG soap ONLY FROM YOUR NECK DOWN TO YOUR TOES (washing for 3-5 minutes)  DO NOT use CHG soap on face, private areas, open wounds, or sores.  Pay special attention to the area where your surgery is being performed.  If you are having back surgery, having someone wash your back for you may be helpful. Wait 2 minutes after CHG soap is applied, then you may rinse off the CHG soap.  Pat dry with a clean towel  Put on clean pajamas    Additional instructions for the day of surgery: DO NOT APPLY any lotions, deodorants, cologne, or perfumes.   Do not wear jewelry or makeup Do not wear nail polish, gel polish, artificial nails, or any other type of covering on natural nails (fingers and toes) Do not bring valuables to the hospital. [facility] is not responsible for valuables/personal belongings. Put on clean/comfortable clothes.  Please brush your teeth.  Ask your nurse before applying any prescription medications to the skin.

## 2023-04-14 ENCOUNTER — Encounter (HOSPITAL_COMMUNITY)
Admission: RE | Admit: 2023-04-14 | Discharge: 2023-04-14 | Disposition: A | Discharge status: Home / Self Care | Payer: Medicaid Other | Source: Ambulatory Visit | Attending: Plastic Surgery | Admitting: Plastic Surgery

## 2023-04-14 ENCOUNTER — Encounter (HOSPITAL_COMMUNITY): Payer: Self-pay

## 2023-04-14 ENCOUNTER — Other Ambulatory Visit: Payer: Self-pay

## 2023-04-14 VITALS — BP 127/74 | HR 69 | Temp 98.4°F | Resp 17 | Ht 68.0 in | Wt 165.6 lb

## 2023-04-14 DIAGNOSIS — Z01812 Encounter for preprocedural laboratory examination: Secondary | ICD-10-CM | POA: Diagnosis present

## 2023-04-14 DIAGNOSIS — Z01818 Encounter for other preprocedural examination: Secondary | ICD-10-CM

## 2023-04-14 HISTORY — DX: Unspecified osteoarthritis, unspecified site: M19.90

## 2023-04-14 LAB — CBC
HCT: 48.5 % — ABNORMAL HIGH (ref 36.0–46.0)
Hemoglobin: 15.6 g/dL — ABNORMAL HIGH (ref 12.0–15.0)
MCH: 29.7 pg (ref 26.0–34.0)
MCHC: 32.2 g/dL (ref 30.0–36.0)
MCV: 92.4 fL (ref 80.0–100.0)
Platelets: 243 10*3/uL (ref 150–400)
RBC: 5.25 MIL/uL — ABNORMAL HIGH (ref 3.87–5.11)
RDW: 12.8 % (ref 11.5–15.5)
WBC: 5.9 10*3/uL (ref 4.0–10.5)
nRBC: 0 % (ref 0.0–0.2)

## 2023-04-14 NOTE — Progress Notes (Signed)
Spoke to [place], Dr. Maude Leriche scheduler, and she is going to let Dr. Leta Baptist know that the consent order needs to be modified to include "free nipple grafts."

## 2023-04-14 NOTE — Progress Notes (Signed)
PCP - Mady Haagensen, PA-C Cardiologist - denies  PPM/ICD - denies   Chest x-ray - denies EKG - denies Stress Test - denies ECHO - denies Cardiac Cath - denies  Sleep Study - denies   DM- denies  ASA/Blood Thinner Instructions: n/a   ERAS Protcol - yes, no drink   COVID TEST- n/a   Anesthesia review: no  Patient denies shortness of breath, fever, cough and chest pain at PAT appointment   All instructions explained to the patient, with a verbal understanding of the material. Patient agrees to go over the instructions while at home for a better understanding.  The opportunity to ask questions was provided.

## 2023-04-18 ENCOUNTER — Other Ambulatory Visit: Payer: Self-pay

## 2023-04-18 ENCOUNTER — Encounter (HOSPITAL_COMMUNITY): Payer: Self-pay | Admitting: Plastic Surgery

## 2023-04-18 ENCOUNTER — Ambulatory Visit (HOSPITAL_BASED_OUTPATIENT_CLINIC_OR_DEPARTMENT_OTHER): Payer: Medicaid Other

## 2023-04-18 ENCOUNTER — Ambulatory Visit (HOSPITAL_COMMUNITY): Payer: Medicaid Other

## 2023-04-18 ENCOUNTER — Ambulatory Visit (HOSPITAL_COMMUNITY)
Admission: RE | Admit: 2023-04-18 | Discharge: 2023-04-18 | Disposition: A | Discharge status: Home / Self Care | Payer: Medicaid Other | Source: Ambulatory Visit | Attending: Plastic Surgery | Admitting: Plastic Surgery

## 2023-04-18 ENCOUNTER — Encounter (HOSPITAL_COMMUNITY)
Admission: RE | Disposition: A | Discharge status: Home / Self Care | Payer: Self-pay | Source: Ambulatory Visit | Attending: Plastic Surgery

## 2023-04-18 DIAGNOSIS — F640 Transsexualism: Secondary | ICD-10-CM | POA: Diagnosis present

## 2023-04-18 DIAGNOSIS — F32A Depression, unspecified: Secondary | ICD-10-CM | POA: Diagnosis not present

## 2023-04-18 DIAGNOSIS — F649 Gender identity disorder, unspecified: Secondary | ICD-10-CM

## 2023-04-18 DIAGNOSIS — J45909 Unspecified asthma, uncomplicated: Secondary | ICD-10-CM | POA: Insufficient documentation

## 2023-04-18 DIAGNOSIS — Z79890 Hormone replacement therapy (postmenopausal): Secondary | ICD-10-CM | POA: Insufficient documentation

## 2023-04-18 DIAGNOSIS — R9230 Dense breasts, unspecified: Secondary | ICD-10-CM | POA: Insufficient documentation

## 2023-04-18 DIAGNOSIS — Z87891 Personal history of nicotine dependence: Secondary | ICD-10-CM | POA: Diagnosis not present

## 2023-04-18 DIAGNOSIS — F419 Anxiety disorder, unspecified: Secondary | ICD-10-CM | POA: Diagnosis not present

## 2023-04-18 DIAGNOSIS — Z01818 Encounter for other preprocedural examination: Secondary | ICD-10-CM

## 2023-04-18 HISTORY — PX: SIMPLE MASTECTOMY WITH AXILLARY SENTINEL NODE BIOPSY: SHX6098

## 2023-04-18 LAB — POCT PREGNANCY, URINE: Preg Test, Ur: NEGATIVE

## 2023-04-18 SURGERY — SIMPLE MASTECTOMY
Anesthesia: General | Site: Breast | Laterality: Bilateral

## 2023-04-18 MED ORDER — ORAL CARE MOUTH RINSE: 15.0000 mL | Freq: Once | OROMUCOSAL | Status: AC

## 2023-04-18 MED ORDER — CELECOXIB 200 MG PO CAPS: 200.0000 mg | ORAL_CAPSULE | ORAL | Status: AC

## 2023-04-18 MED ORDER — CHLORHEXIDINE GLUCONATE CLOTH 2 % EX PADS: 6.0000 | MEDICATED_PAD | Freq: Once | CUTANEOUS | Status: DC

## 2023-04-18 MED ORDER — MIDAZOLAM HCL 2 MG/2ML IJ SOLN
INTRAMUSCULAR | Status: DC | PRN
  Administered 2023-04-18: 2 mg via INTRAVENOUS

## 2023-04-18 MED ORDER — 0.9 % SODIUM CHLORIDE (POUR BTL) OPTIME
TOPICAL | Status: DC | PRN
  Administered 2023-04-18: 1000 mL

## 2023-04-18 MED ORDER — CIPROFLOXACIN IN D5W 400 MG/200ML IV SOLN
400.0000 mg | INTRAVENOUS | Status: AC
  Administered 2023-04-18: 400 mg via INTRAVENOUS

## 2023-04-18 MED ORDER — DEXAMETHASONE SODIUM PHOSPHATE 10 MG/ML IJ SOLN
INTRAMUSCULAR | Status: DC | PRN
  Administered 2023-04-18: 10 mg via INTRAVENOUS

## 2023-04-18 MED ORDER — KETAMINE HCL 10 MG/ML IJ SOLN
INTRAMUSCULAR | Status: DC | PRN
  Administered 2023-04-18: 20 mg via INTRAVENOUS
  Administered 2023-04-18: 30 mg via INTRAVENOUS

## 2023-04-18 MED ORDER — ACETAMINOPHEN 500 MG PO TABS: 1000.0000 mg | ORAL_TABLET | ORAL | Status: AC

## 2023-04-18 MED ORDER — ONDANSETRON HCL 4 MG/2ML IJ SOLN
INTRAMUSCULAR | Status: AC
  Filled 2023-04-18: qty 2

## 2023-04-18 MED ORDER — SUGAMMADEX SODIUM 200 MG/2ML IV SOLN
INTRAVENOUS | Status: DC | PRN
  Administered 2023-04-18: 200 mg via INTRAVENOUS

## 2023-04-18 MED ORDER — LIDOCAINE 2% (20 MG/ML) 5 ML SYRINGE
INTRAMUSCULAR | Status: DC | PRN
  Administered 2023-04-18: 60 mg via INTRAVENOUS
  Administered 2023-04-18: 30 mg via INTRAVENOUS

## 2023-04-18 MED ORDER — FENTANYL CITRATE (PF) 100 MCG/2ML IJ SOLN
25.0000 ug | INTRAMUSCULAR | Status: DC | PRN
  Administered 2023-04-18: 25 ug via INTRAVENOUS

## 2023-04-18 MED ORDER — LIDOCAINE 2% (20 MG/ML) 5 ML SYRINGE
INTRAMUSCULAR | Status: AC
  Filled 2023-04-18: qty 5

## 2023-04-18 MED ORDER — CELECOXIB 200 MG PO CAPS
ORAL_CAPSULE | ORAL | Status: AC
  Administered 2023-04-18: 200 mg via ORAL
  Filled 2023-04-18: qty 1

## 2023-04-18 MED ORDER — FENTANYL CITRATE (PF) 250 MCG/5ML IJ SOLN
INTRAMUSCULAR | Status: AC
  Filled 2023-04-18: qty 5

## 2023-04-18 MED ORDER — FENTANYL CITRATE (PF) 100 MCG/2ML IJ SOLN
INTRAMUSCULAR | Status: AC
  Filled 2023-04-18: qty 2

## 2023-04-18 MED ORDER — PHENYLEPHRINE 80 MCG/ML (10ML) SYRINGE FOR IV PUSH (FOR BLOOD PRESSURE SUPPORT)
PREFILLED_SYRINGE | INTRAVENOUS | Status: DC | PRN
  Administered 2023-04-18: 80 ug via INTRAVENOUS

## 2023-04-18 MED ORDER — CHLORHEXIDINE GLUCONATE 0.12 % MT SOLN
15.0000 mL | Freq: Once | OROMUCOSAL | Status: AC
  Administered 2023-04-18: 15 mL via OROMUCOSAL
  Filled 2023-04-18: qty 15

## 2023-04-18 MED ORDER — EPHEDRINE SULFATE-NACL 50-0.9 MG/10ML-% IV SOSY
PREFILLED_SYRINGE | INTRAVENOUS | Status: DC | PRN
  Administered 2023-04-18 (×5): 5 mg via INTRAVENOUS

## 2023-04-18 MED ORDER — DEXMEDETOMIDINE HCL IN NACL 80 MCG/20ML IV SOLN
INTRAVENOUS | Status: DC | PRN
  Administered 2023-04-18: 10 ug via INTRAVENOUS

## 2023-04-18 MED ORDER — CIPROFLOXACIN IN D5W 400 MG/200ML IV SOLN
INTRAVENOUS | Status: AC
  Filled 2023-04-18: qty 200

## 2023-04-18 MED ORDER — ACETAMINOPHEN 500 MG PO TABS
ORAL_TABLET | ORAL | Status: AC
  Administered 2023-04-18: 1000 mg via ORAL
  Filled 2023-04-18: qty 2

## 2023-04-18 MED ORDER — LACTATED RINGERS IV SOLN: INTRAVENOUS | Status: DC

## 2023-04-18 MED ORDER — DEXAMETHASONE SODIUM PHOSPHATE 10 MG/ML IJ SOLN
INTRAMUSCULAR | Status: AC
  Filled 2023-04-18: qty 1

## 2023-04-18 MED ORDER — ONDANSETRON HCL 4 MG/2ML IJ SOLN
INTRAMUSCULAR | Status: DC | PRN
  Administered 2023-04-18: 4 mg via INTRAVENOUS

## 2023-04-18 MED ORDER — FENTANYL CITRATE (PF) 250 MCG/5ML IJ SOLN
INTRAMUSCULAR | Status: DC | PRN
  Administered 2023-04-18: 50 ug via INTRAVENOUS
  Administered 2023-04-18 (×2): 100 ug via INTRAVENOUS
  Administered 2023-04-18 (×2): 50 ug via INTRAVENOUS

## 2023-04-18 MED ORDER — AMISULPRIDE (ANTIEMETIC) 5 MG/2ML IV SOLN
10.0000 mg | Freq: Once | INTRAVENOUS | Status: AC
  Administered 2023-04-18: 10 mg via INTRAVENOUS

## 2023-04-18 MED ORDER — ROCURONIUM BROMIDE 10 MG/ML (PF) SYRINGE
PREFILLED_SYRINGE | INTRAVENOUS | Status: AC
  Filled 2023-04-18: qty 10

## 2023-04-18 MED ORDER — AMISULPRIDE (ANTIEMETIC) 5 MG/2ML IV SOLN
INTRAVENOUS | Status: AC
  Filled 2023-04-18: qty 4

## 2023-04-18 MED ORDER — BUPIVACAINE HCL (PF) 0.5 % IJ SOLN
INTRAMUSCULAR | Status: DC | PRN
  Administered 2023-04-18: 30 mL

## 2023-04-18 MED ORDER — PROPOFOL 10 MG/ML IV BOLUS
INTRAVENOUS | Status: DC | PRN
  Administered 2023-04-18: 20 mg via INTRAVENOUS
  Administered 2023-04-18: 150 mg via INTRAVENOUS

## 2023-04-18 MED ORDER — MIDAZOLAM HCL 2 MG/2ML IJ SOLN
INTRAMUSCULAR | Status: AC
  Filled 2023-04-18: qty 2

## 2023-04-18 MED ORDER — KETAMINE HCL 50 MG/5ML IJ SOSY
PREFILLED_SYRINGE | INTRAMUSCULAR | Status: AC
  Filled 2023-04-18: qty 5

## 2023-04-18 MED ORDER — PROPOFOL 10 MG/ML IV BOLUS
INTRAVENOUS | Status: AC
  Filled 2023-04-18: qty 20

## 2023-04-18 MED ORDER — ROCURONIUM BROMIDE 10 MG/ML (PF) SYRINGE
PREFILLED_SYRINGE | INTRAVENOUS | Status: DC | PRN
  Administered 2023-04-18: 60 mg via INTRAVENOUS

## 2023-04-18 SURGICAL SUPPLY — 41 items
BAG COUNTER SPONGE SURGICOUNT (BAG) ×1 IMPLANT
BINDER BREAST LRG (GAUZE/BANDAGES/DRESSINGS) IMPLANT
BINDER BREAST MEDIUM (GAUZE/BANDAGES/DRESSINGS) IMPLANT
BINDER BREAST XLRG (GAUZE/BANDAGES/DRESSINGS) IMPLANT
BINDER BREAST XXLRG (GAUZE/BANDAGES/DRESSINGS) IMPLANT
BLADE SURG 10 STRL SS (BLADE) ×4 IMPLANT
BNDG ELASTIC 6X10 VLCR STRL LF (GAUZE/BANDAGES/DRESSINGS) IMPLANT
BNDG GAUZE DERMACEA FLUFF 4 (GAUZE/BANDAGES/DRESSINGS) ×2 IMPLANT
CANISTER SUCT 3000ML PPV (MISCELLANEOUS) ×1 IMPLANT
CHLORAPREP W/TINT 26 (MISCELLANEOUS) ×2 IMPLANT
COVER SURGICAL LIGHT HANDLE (MISCELLANEOUS) ×1 IMPLANT
DERMABOND ADVANCED .7 DNX12 (GAUZE/BANDAGES/DRESSINGS) ×2 IMPLANT
DRAIN CHANNEL 15F RND FF W/TCR (WOUND CARE) IMPLANT
DRAPE HALF SHEET 40X57 (DRAPES) ×2 IMPLANT
DRAPE TOP ARMCOVERS (MISCELLANEOUS) ×1 IMPLANT
DRAPE U-SHAPE 76X120 STRL (DRAPES) ×1 IMPLANT
ELECT COATED BLADE 2.86 ST (ELECTRODE) ×1 IMPLANT
ELECT REM PT RETURN 9FT ADLT (ELECTROSURGICAL) ×1
ELECTRODE REM PT RTRN 9FT ADLT (ELECTROSURGICAL) ×1 IMPLANT
EVACUATOR SILICONE 100CC (DRAIN) IMPLANT
GAUZE PAD ABD 8X10 STRL (GAUZE/BANDAGES/DRESSINGS) ×2 IMPLANT
GLOVE BIO SURGEON STRL SZ 6 (GLOVE) ×2 IMPLANT
GOWN STRL REUS W/ TWL LRG LVL3 (GOWN DISPOSABLE) ×2 IMPLANT
MARKER SKIN DUAL TIP RULER LAB (MISCELLANEOUS) IMPLANT
NDL HYPO 25GX1X1/2 BEV (NEEDLE) ×1 IMPLANT
NEEDLE HYPO 25GX1X1/2 BEV (NEEDLE) ×1 IMPLANT
NS IRRIG 1000ML POUR BTL (IV SOLUTION) ×1 IMPLANT
PACK GENERAL/GYN (CUSTOM PROCEDURE TRAY) ×1 IMPLANT
PENCIL BUTTON HOLSTER BLD 10FT (ELECTRODE) ×1 IMPLANT
PIN SAFETY STERILE (MISCELLANEOUS) ×1 IMPLANT
SPIKE FLUID TRANSFER (MISCELLANEOUS) ×1 IMPLANT
SPONGE T-LAP 18X18 ~~LOC~~+RFID (SPONGE) ×2 IMPLANT
STAPLER VISISTAT 35W (STAPLE) ×1 IMPLANT
SUT CHROMIC 5 0 P 3 (SUTURE) IMPLANT
SUT ETHILON 2 0 FS 18 (SUTURE) ×1 IMPLANT
SUT MNCRL AB 4-0 PS2 18 (SUTURE) ×2 IMPLANT
SUT SILK 2 0 SH (SUTURE) IMPLANT
SUT VIC AB 3-0 SH 27X BRD (SUTURE) IMPLANT
SYR CONTROL 10ML LL (SYRINGE) ×1 IMPLANT
TOWEL GREEN STERILE (TOWEL DISPOSABLE) ×1 IMPLANT
WATER STERILE IRR 1000ML POUR (IV SOLUTION) ×1 IMPLANT

## 2023-04-18 NOTE — Interval H&P Note (Signed)
History and Physical Interval Note:  04/18/2023 9:59 AM  Susan Tran  has presented today for surgery, with the diagnosis of Gender dysphoria.  The various methods of treatment have been discussed with the patient and family. After consideration of risks, benefits and other options for treatment, the patient has consented to  Procedure(s): SIMPLE MASTECTOMY (Bilateral) as a surgical intervention.  The patient's history has been reviewed, patient examined, no change in status, stable for surgery.  I have reviewed the patient's chart and labs.  Questions were answered to the patient's satisfaction.     Irean Hong Amelia Burgard

## 2023-04-18 NOTE — Transfer of Care (Signed)
Immediate Anesthesia Transfer of Care Note  Patient: Gregoria Vito  Procedure(s) Performed: SIMPLE MASTECTOMY (Bilateral: Breast)  Patient Location: PACU  Anesthesia Type:General  Level of Consciousness: drowsy  Airway & Oxygen Therapy: Patient Spontanous Breathing and Patient connected to face mask oxygen  Post-op Assessment: Report given to RN and Post -op Vital signs reviewed and stable  Post vital signs: Reviewed and stable  Last Vitals:  Vitals Value Taken Time  BP 134/74 04/18/23 1325  Temp 36.2 C 04/18/23 1325  Pulse 87 04/18/23 1327  Resp 15 04/18/23 1327  SpO2 99 % 04/18/23 1327  Vitals shown include unfiled device data.  Last Pain:  Vitals:   04/18/23 0935  TempSrc:   PainSc: 9          Complications: No notable events documented.

## 2023-04-18 NOTE — Anesthesia Preprocedure Evaluation (Addendum)
Anesthesia Evaluation  Patient identified by MRN, date of birth, ID band Patient awake    Reviewed: Allergy & Precautions, NPO status , Patient's Chart, lab work & pertinent test results  Airway Mallampati: I  TM Distance: >3 FB Neck ROM: Full    Dental no notable dental hx. (+) Teeth Intact, Dental Advisory Given   Pulmonary asthma , former smoker   Pulmonary exam normal breath sounds clear to auscultation       Cardiovascular negative cardio ROS Normal cardiovascular exam Rhythm:Regular Rate:Normal     Neuro/Psych  Headaches PSYCHIATRIC DISORDERS Anxiety Depression       GI/Hepatic negative GI ROS,,,(+)     substance abuse  marijuana use  Endo/Other  negative endocrine ROS    Renal/GU negative Renal ROS  negative genitourinary   Musculoskeletal  (+) Arthritis ,    Abdominal   Peds  Hematology negative hematology ROS (+)   Anesthesia Other Findings   Reproductive/Obstetrics                             Anesthesia Physical Anesthesia Plan  ASA: 2  Anesthesia Plan: General   Post-op Pain Management: Tylenol PO (pre-op)* and Ketamine IV*   Induction: Intravenous  PONV Risk Score and Plan: 3 and Midazolam, Dexamethasone and Ondansetron  Airway Management Planned: Oral ETT  Additional Equipment:   Intra-op Plan:   Post-operative Plan: Extubation in OR  Informed Consent: I have reviewed the patients History and Physical, chart, labs and discussed the procedure including the risks, benefits and alternatives for the proposed anesthesia with the patient or authorized representative who has indicated his/her understanding and acceptance.     Dental advisory given  Plan Discussed with: CRNA  Anesthesia Plan Comments:        Anesthesia Quick Evaluation

## 2023-04-18 NOTE — Op Note (Signed)
Operative Note   DATE OF OPERATION: 11.22.2024  LOCATION: [facility]-outpatient  SURGICAL DIVISION: Plastic Surgery  PREOPERATIVE DIAGNOSES:  1. Gender dysphoria  POSTOPERATIVE DIAGNOSES:  same  PROCEDURE:  Bilateral mastectomies  SURGEON: Glenna Fellows MD MBA  ASSISTANT: none  ANESTHESIA:  General.   EBL: 50 ml  COMPLICATIONS: None immediate.   INDICATIONS FOR PROCEDURE:  The patient, Susan Tran, is a 27 y.o. adult born on [birth year], is here for gender affirming chest surgery.    FINDINGS: Bilateral simple mastectomies completed through areola approach.  DESCRIPTION OF PROCEDURE:  The patient's operative site was marked with the patient in the preoperative area. The patient was taken to the operating room. SCDs were placed and IV antibiotics were given. The patient's operative site was prepped and draped in a sterile fashion. A time out was performed and all information was confirmed to be correct. Incision made left inferior areola border. A cuff of breast tissue maintained beneath nipple areola complex. Skin elevated off breast tissue in subfascial plane to pectoralis muscle and chest wall circumferentially. Dissection completed caudal to inframammary fold elevated, and scoring of fold completed. Breast elevated off muscular fascia. Cavity irrigated with saline and hemostasis ensured. Local anesthetic infiltrated. 15 Fr JP placed percutaneously and secured with 2-0 nylon. Closure fascia completed with interrupted 3-0 vicryl. Dermis closed with running 4-0 vicryl. Skin closure completed with 5-0 plain gut running horizontal mattress sutures.    I then directed my attention to right breast. Incision made in inferior areola border. A cuff of breast tissue maintained beneath nipple areola complex. Skin elevated off breast tissue in subfascial plane to pectoralis muscle and chest wall circumferentially. Dissection completed caudal to inframammary fold elevated, and  scoring of fold completed. Breast elevated off muscular fascia. Cavity irrigated with saline and hemostasis ensured. Local anesthetic infiltrated. 15 Fr JP placed percutaneously and secured with 2-0 nylon. Closure fascia completed with interrupted 3-0 vicryl. Dermis closed with running 4-0 monocryl. Skin closure completed with 5-0 plain gut running horizontal mattress sutures. Dermabond applied followed by dry dressing, Ace wrap.   The patient was allowed to wake from anesthesia, extubated and taken to the recovery room in satisfactory condition.   SPECIMENS: right and left mastectomy  DRAINS: 15 Fr JP in right and left breast  Glenna Fellows, MD [facility] Plastic & Reconstructive Surgery  Office/ physician access line after hours [phone]

## 2023-04-18 NOTE — Anesthesia Postprocedure Evaluation (Signed)
Anesthesia Post Note  Patient: Susan Tran  Procedure(s) Performed: SIMPLE MASTECTOMY (Bilateral: Breast)     Patient location during evaluation: PACU Anesthesia Type: General Level of consciousness: awake and alert Pain management: pain level controlled Vital Signs Assessment: post-procedure vital signs reviewed and stable Respiratory status: spontaneous breathing, nonlabored ventilation, respiratory function stable and patient connected to nasal cannula oxygen Cardiovascular status: blood pressure returned to baseline and stable Postop Assessment: no apparent nausea or vomiting Anesthetic complications: no  No notable events documented.  Last Vitals:  Vitals:   04/18/23 1445 04/18/23 1500  BP: 136/78 130/86  Pulse: 83 79  Resp: 12 12  Temp:    SpO2: 97% 97%    Last Pain:  Vitals:   04/18/23 1325  TempSrc:   PainSc: Asleep                 Aamori Mcmasters L Bhargav Barbaro

## 2023-04-18 NOTE — Anesthesia Procedure Notes (Signed)
Procedure Name: Intubation Date/Time: 04/18/2023 10:37 AM  Performed by: April Holding, CRNAPre-anesthesia Checklist: Patient identified, Emergency Drugs available, Suction available and Patient being monitored Patient Re-evaluated:Patient Re-evaluated prior to induction Oxygen Delivery Method: Circle System Utilized Preoxygenation: Pre-oxygenation with 100% oxygen Induction Type: IV induction Ventilation: Mask ventilation without difficulty Laryngoscope Size: Miller and 2 Grade View: Grade I Tube type: Oral Tube size: 7.0 mm Number of attempts: 1 Airway Equipment and Method: Stylet and Oral airway Placement Confirmation: ETT inserted through vocal cords under direct vision, positive ETCO2 and breath sounds checked- equal and bilateral Secured at: 22 cm Tube secured with: Tape Dental Injury: Teeth and Oropharynx as per pre-operative assessment

## 2023-04-19 ENCOUNTER — Encounter (HOSPITAL_COMMUNITY): Payer: Self-pay | Admitting: Plastic Surgery

## 2023-04-21 LAB — SURGICAL PATHOLOGY

## 2023-04-23 ENCOUNTER — Other Ambulatory Visit: Payer: Self-pay | Admitting: Medical Genetics

## 2023-04-30 ENCOUNTER — Other Ambulatory Visit: Payer: Self-pay | Admitting: Medical Genetics

## 2023-06-03 ENCOUNTER — Other Ambulatory Visit (HOSPITAL_COMMUNITY): Payer: Medicaid Other

## 2023-07-02 ENCOUNTER — Other Ambulatory Visit (HOSPITAL_COMMUNITY): Payer: Medicaid Other

## 2024-02-25 ENCOUNTER — Encounter (INDEPENDENT_AMBULATORY_CARE_PROVIDER_SITE_OTHER): Payer: Self-pay

## 2024-03-05 ENCOUNTER — Other Ambulatory Visit: Payer: Self-pay | Admitting: Medical Genetics

## 2024-03-05 DIAGNOSIS — Z006 Encounter for examination for normal comparison and control in clinical research program: Secondary | ICD-10-CM

## 2024-04-02 LAB — GENECONNECT MOLECULAR SCREEN: Genetic Analysis Overall Interpretation: NEGATIVE
